# Patient Record
Sex: Female | Born: 1955
Health system: Southern US, Community
[De-identification: ages and names within clinical notes are randomized; demographics above are authoritative.]

## PROBLEM LIST (undated history)

## (undated) DIAGNOSIS — L409 Psoriasis, unspecified: Secondary | ICD-10-CM

## (undated) DIAGNOSIS — K5792 Diverticulitis of intestine, part unspecified, without perforation or abscess without bleeding: Secondary | ICD-10-CM

## (undated) DIAGNOSIS — E079 Disorder of thyroid, unspecified: Secondary | ICD-10-CM

## (undated) DIAGNOSIS — F32A Depression, unspecified: Secondary | ICD-10-CM

## (undated) DIAGNOSIS — I10 Essential (primary) hypertension: Secondary | ICD-10-CM

## (undated) DIAGNOSIS — L4050 Arthropathic psoriasis, unspecified: Secondary | ICD-10-CM

## (undated) DIAGNOSIS — R0600 Dyspnea, unspecified: Secondary | ICD-10-CM

## (undated) DIAGNOSIS — K219 Gastro-esophageal reflux disease without esophagitis: Secondary | ICD-10-CM

## (undated) DIAGNOSIS — T7840XA Allergy, unspecified, initial encounter: Secondary | ICD-10-CM

## (undated) DIAGNOSIS — R519 Headache, unspecified: Secondary | ICD-10-CM

## (undated) DIAGNOSIS — J189 Pneumonia, unspecified organism: Secondary | ICD-10-CM

## (undated) DIAGNOSIS — G4730 Sleep apnea, unspecified: Secondary | ICD-10-CM

## (undated) DIAGNOSIS — D649 Anemia, unspecified: Secondary | ICD-10-CM

## (undated) HISTORY — DX: Diverticulitis of intestine, part unspecified, without perforation or abscess without bleeding: K57.92

## (undated) HISTORY — DX: Disorder of thyroid, unspecified: E07.9

## (undated) HISTORY — DX: Sleep apnea, unspecified: G47.30

## (undated) HISTORY — PX: COLON SURGERY: SHX602

## (undated) HISTORY — PX: EYE SURGERY: SHX253

## (undated) HISTORY — PX: WISDOM TOOTH EXTRACTION: SHX21

## (undated) HISTORY — PX: BUNIONECTOMY: SHX129

## (undated) HISTORY — PX: TONSILLECTOMY: SUR1361

## (undated) HISTORY — DX: Allergy, unspecified, initial encounter: T78.40XA

## (undated) HISTORY — DX: Depression, unspecified: F32.A

## (undated) HISTORY — PX: COLOSTOMY TAKEDOWN: SHX5258

## (undated) HISTORY — DX: Psoriasis, unspecified: L40.9

---

## 2016-02-24 DIAGNOSIS — R55 Syncope and collapse: Secondary | ICD-10-CM | POA: Insufficient documentation

## 2016-02-24 DIAGNOSIS — R319 Hematuria, unspecified: Secondary | ICD-10-CM | POA: Insufficient documentation

## 2016-02-24 DIAGNOSIS — K5781 Diverticulitis of intestine, part unspecified, with perforation and abscess with bleeding: Secondary | ICD-10-CM | POA: Insufficient documentation

## 2016-02-24 DIAGNOSIS — K5732 Diverticulitis of large intestine without perforation or abscess without bleeding: Secondary | ICD-10-CM | POA: Insufficient documentation

## 2018-09-02 DIAGNOSIS — Z03818 Encounter for observation for suspected exposure to other biological agents ruled out: Secondary | ICD-10-CM | POA: Diagnosis not present

## 2018-09-02 DIAGNOSIS — E039 Hypothyroidism, unspecified: Secondary | ICD-10-CM | POA: Diagnosis not present

## 2018-12-02 DIAGNOSIS — K219 Gastro-esophageal reflux disease without esophagitis: Secondary | ICD-10-CM | POA: Diagnosis not present

## 2018-12-02 DIAGNOSIS — Z79899 Other long term (current) drug therapy: Secondary | ICD-10-CM | POA: Diagnosis not present

## 2018-12-02 DIAGNOSIS — E039 Hypothyroidism, unspecified: Secondary | ICD-10-CM | POA: Diagnosis not present

## 2018-12-02 DIAGNOSIS — E782 Mixed hyperlipidemia: Secondary | ICD-10-CM | POA: Diagnosis not present

## 2018-12-02 DIAGNOSIS — R7303 Prediabetes: Secondary | ICD-10-CM | POA: Diagnosis not present

## 2019-01-07 DIAGNOSIS — Z8719 Personal history of other diseases of the digestive system: Secondary | ICD-10-CM | POA: Diagnosis not present

## 2019-01-07 DIAGNOSIS — R1032 Left lower quadrant pain: Secondary | ICD-10-CM | POA: Diagnosis not present

## 2019-02-06 DIAGNOSIS — R7303 Prediabetes: Secondary | ICD-10-CM | POA: Diagnosis not present

## 2019-02-06 DIAGNOSIS — E039 Hypothyroidism, unspecified: Secondary | ICD-10-CM | POA: Diagnosis not present

## 2019-02-06 DIAGNOSIS — E782 Mixed hyperlipidemia: Secondary | ICD-10-CM | POA: Diagnosis not present

## 2019-02-06 DIAGNOSIS — Z79899 Other long term (current) drug therapy: Secondary | ICD-10-CM | POA: Diagnosis not present

## 2019-02-25 DIAGNOSIS — Z20828 Contact with and (suspected) exposure to other viral communicable diseases: Secondary | ICD-10-CM | POA: Diagnosis not present

## 2019-04-19 ENCOUNTER — Ambulatory Visit: Payer: Self-pay | Attending: Internal Medicine

## 2019-04-19 DIAGNOSIS — Z23 Encounter for immunization: Secondary | ICD-10-CM | POA: Insufficient documentation

## 2019-04-19 NOTE — Progress Notes (Signed)
   Covid-19 Vaccination Clinic  Name:  Siriyah Ambrosius    MRN: [id number] DOB: March 10, 1955  04/19/2019  Ms. Glab was observed post Covid-19 immunization for 30 minutes based on pre-vaccination screening without incidence. She was provided with Vaccine Information Sheet and instruction to access the V-Safe system.   Ms. Cowing was instructed to call 911 with any severe reactions post vaccine: Marland Kitchen Difficulty breathing  . Swelling of your face and throat  . A fast heartbeat  . A bad rash all over your body  . Dizziness and weakness    Immunizations Administered    Name Date Dose VIS Date Route   Pfizer COVID-19 Vaccine 04/19/2019  8:52 AM 0.3 mL 01/30/2019 Intramuscular   Manufacturer: [employer], [employer]   Lot: [id number]   NDC: [address]-8

## 2019-05-12 ENCOUNTER — Ambulatory Visit: Payer: Self-pay | Attending: Internal Medicine

## 2019-05-12 DIAGNOSIS — Z23 Encounter for immunization: Secondary | ICD-10-CM

## 2019-05-12 NOTE — Progress Notes (Signed)
   Covid-19 Vaccination Clinic  Name:  Maymunah Stegemann    MRN: [id number] DOB: 03/09/1955  05/12/2019  Ms. Grumbine was observed post Covid-19 immunization for 15 minutes without incident. She was provided with Vaccine Information Sheet and instruction to access the V-Safe system.   Ms. Childrey was instructed to call 911 with any severe reactions post vaccine: Marland Kitchen Difficulty breathing  . Swelling of face and throat  . A fast heartbeat  . A bad rash all over body  . Dizziness and weakness   Immunizations Administered    Name Date Dose VIS Date Route   Pfizer COVID-19 Vaccine 05/12/2019  8:48 AM 0.3 mL 01/30/2019 Intramuscular   Manufacturer: [employer], [employer]   Lot: [id number]   NDC: [address]-7

## 2019-05-14 DIAGNOSIS — K080 Exfoliation of teeth due to systemic causes: Secondary | ICD-10-CM | POA: Diagnosis not present

## 2019-06-02 DIAGNOSIS — K080 Exfoliation of teeth due to systemic causes: Secondary | ICD-10-CM | POA: Diagnosis not present

## 2019-06-04 DIAGNOSIS — K080 Exfoliation of teeth due to systemic causes: Secondary | ICD-10-CM | POA: Diagnosis not present

## 2019-09-26 DIAGNOSIS — E039 Hypothyroidism, unspecified: Secondary | ICD-10-CM | POA: Diagnosis not present

## 2019-12-22 DIAGNOSIS — M549 Dorsalgia, unspecified: Secondary | ICD-10-CM | POA: Diagnosis not present

## 2019-12-22 DIAGNOSIS — R319 Hematuria, unspecified: Secondary | ICD-10-CM | POA: Diagnosis not present

## 2020-01-28 NOTE — Progress Notes (Signed)
New patient visit   Patient: Sarah Stephenson   DOB: 26-Apr-1955   64 y.o. Female  MRN: [id number] Visit Date: 02/01/2020  Today's healthcare provider: Dortha Kern, PA-C   No chief complaint on file.  Subjective    Sarah Stephenson is a 64 y.o. female who presents today as a new patient to establish care.  HPI  Complaints today of fatigue, thinks it may be her thyroid.  Thyroid level was a little off but no changes were made at that time. She will need refill of her medications. Would like referral for her psoriasis.  Past Medical History:  Diagnosis Date  . Allergy   . Depression   . Diverticulitis   . Psoriasis   . Sleep apnea   . Thyroid disease    Family History  Problem Relation Age of Onset  . Hypertension Mother   . Hyperlipidemia Mother   . Arthritis Father   . Hyperlipidemia Father   . COPD Father   . Thyroid disease Sister   . Hypertension Brother   . Thyroid disease Brother   . Thyroid disease Brother    Past Surgical History:  Procedure Laterality Date  . BUNIONECTOMY    . COLON SURGERY - Colostomy for diverticulitis in 2018 (reversed 3 months later).    . EYE SURGERY    . TONSILLECTOMY    . WISDOM TOOTH EXTRACTION     Social History   Socioeconomic History  . Marital status: Married    Spouse name: Not on file  . Number of children: Not on file  . Years of education: Not on file  . Highest education level: Not on file  Occupational History  . Not on file  Tobacco Use  . Smoking status: Not on file  . Smokeless tobacco: Not on file  Substance and Sexual Activity  . Alcohol use: Not on file  . Drug use: Not on file  . Sexual activity: Not on file  Other Topics Concern  . Not on file  Social History Narrative  . Not on file   Social Determinants of Health   Financial Resource Strain: Not on file  Food Insecurity: Not on file  Transportation Needs: Not on file  Physical Activity: Not on file  Stress: Not on file  Social  Connections: Not on file   Outpatient Medications Prior to Visit  Medication Sig  . levothyroxine (SYNTHROID) 150 MCG tablet Take 150 mcg by mouth daily before breakfast.   No facility-administered medications prior to visit.   Allergies  Allergen Reactions  . Sulfa Antibiotics     Hives    Immunization History  Administered Date(s) Administered  . PFIZER SARS-COV-2 Vaccination 04/19/2019, 05/12/2019    Health Maintenance  Topic Date Due  . Hepatitis C Screening  Never done  . HIV Screening  Never done  . PAP SMEAR-Modifier  Never done  . MAMMOGRAM  Never done  . COLONOSCOPY  Never done  . INFLUENZA VACCINE  09/20/2019  . COVID-19 Vaccine (3 - Booster for Pfizer series) 11/12/2019  . TETANUS/TDAP  10/18/2026    Patient Care Team: Kristian Mogg, Maryjean Morn as PCP - General (Family Medicine)  Review of Systems  Constitutional: Positive for fatigue.  Respiratory: Negative.   Cardiovascular: Positive for leg swelling.  Endocrine: Negative.   Genitourinary: Negative.   Musculoskeletal: Positive for arthralgias and back pain.  Skin: Positive for rash.  Allergic/Immunologic: Negative.   Hematological: Negative.       Objective  BP (!) 162/81 (BP Location: Right Arm, Patient Position: Sitting, Cuff Size: Normal)   Pulse 85   Temp 98.1 F (36.7 C) (Oral)   Ht 5\' 7"  (1.702 m)   Wt 213 lb (96.6 kg)   SpO2 98%   BMI 33.36 kg/m  Physical Exam Constitutional:      General: She is not in acute distress.    Appearance: She is well-developed and well-nourished.  HENT:     Head: Normocephalic and atraumatic.     Right Ear: Hearing and tympanic membrane normal.     Left Ear: Hearing and tympanic membrane normal.     Nose: Nose normal.     Mouth/Throat:     Pharynx: Oropharynx is clear.  Eyes:     General: Lids are normal. No scleral icterus.       Right eye: No discharge.        Left eye: No discharge.     Conjunctiva/sclera: Conjunctivae normal.   Cardiovascular:     Rate and Rhythm: Normal rate and regular rhythm.     Pulses: Normal pulses.     Heart sounds: Normal heart sounds.  Pulmonary:     Effort: Pulmonary effort is normal. No respiratory distress.  Abdominal:     General: Bowel sounds are normal.     Palpations: Abdomen is soft.  Musculoskeletal:        General: Normal range of motion.     Cervical back: Neck supple.  Skin:    General: Skin is intact.     Findings: Rash present. No lesion.     Comments: Erythematous and scaling patches on extensor surfaces of elbows and knee bilaterally.  Neurological:     Mental Status: She is alert and oriented to person, place, and time.  Psychiatric:        Mood and Affect: Mood and affect normal.        Speech: Speech normal.        Behavior: Behavior normal.        Thought Content: Thought content normal.      Depression Screen PHQ 2/9 Scores 02/01/2020  PHQ - 2 Score 2  PHQ- 9 Score 8   No results found for any visits on 02/01/20.  Assessment & Plan     1. Acquired hypothyroidism Ran out of Levothyroxine 150 mcg she has been taking for a couple years. Feeling fatigue but no COVID symptoms. History of hypothyroidism after RAI treatment for Grave's Disease. Has family history of hypothyroidism in some siblings. Recheck labs and refill meds. - T4 - TSH - Comprehensive metabolic panel - CBC with Differential/Platelet - levothyroxine (SYNTHROID) 150 MCG tablet; Take 1 tablet (150 mcg total) by mouth daily before breakfast.  Dispense: 90 tablet; Refill: 1  2. Psoriasis Rash on knees and elbows. Continues moisturizing creams. Some aching joints but no joint swelling. May need referral to dermatologist and/or rheumatologist if it progresses.  3. OSA (obstructive sleep apnea) Diagnosed in 2016 and unable to tolerate CPAP mask and hose.  4. Hx of Graves' disease Diagnosed 2004 and had RAI treatment in 2006. Been on Levothyroxine for acquired hypothyroidism. No  exophthalmos or hair loss.   No follow-ups on file.     I, Vanilla Heatherington, PA-C, have reviewed all documentation for this visit. The documentation on 02/01/20 for the exam, diagnosis, procedures, and orders are all accurate and complete.    02/03/20, PA-C  Dortha Kern [phone] (phone) [phone] (fax)  [facility]

## 2020-02-01 ENCOUNTER — Encounter: Payer: Self-pay | Admitting: Family Medicine

## 2020-02-01 ENCOUNTER — Other Ambulatory Visit: Payer: Self-pay

## 2020-02-01 ENCOUNTER — Ambulatory Visit: Payer: Federal, State, Local not specified - PPO | Admitting: Family Medicine

## 2020-02-01 VITALS — BP 162/81 | HR 85 | Temp 98.1°F | Ht 67.0 in | Wt 213.0 lb

## 2020-02-01 DIAGNOSIS — G4733 Obstructive sleep apnea (adult) (pediatric): Secondary | ICD-10-CM | POA: Diagnosis not present

## 2020-02-01 DIAGNOSIS — Z8639 Personal history of other endocrine, nutritional and metabolic disease: Secondary | ICD-10-CM

## 2020-02-01 DIAGNOSIS — E039 Hypothyroidism, unspecified: Secondary | ICD-10-CM

## 2020-02-01 DIAGNOSIS — L409 Psoriasis, unspecified: Secondary | ICD-10-CM | POA: Diagnosis not present

## 2020-02-01 MED ORDER — LEVOTHYROXINE SODIUM 150 MCG PO TABS: 150.0000 ug | ORAL_TABLET | Freq: Every day | ORAL | 1 refills | Status: DC

## 2020-02-02 ENCOUNTER — Other Ambulatory Visit: Payer: Self-pay

## 2020-02-02 ENCOUNTER — Telehealth: Payer: Self-pay | Admitting: Family Medicine

## 2020-02-02 DIAGNOSIS — E039 Hypothyroidism, unspecified: Secondary | ICD-10-CM

## 2020-02-02 LAB — CBC WITH DIFFERENTIAL/PLATELET
Basophils Absolute: 0 10*3/uL (ref 0.0–0.2)
Basos: 0 %
EOS (ABSOLUTE): 0 10*3/uL (ref 0.0–0.4)
Eos: 0 %
Hematocrit: 43.4 % (ref 34.0–46.6)
Hemoglobin: 15.2 g/dL (ref 11.1–15.9)
Immature Grans (Abs): 0 10*3/uL (ref 0.0–0.1)
Immature Granulocytes: 0 %
Lymphocytes Absolute: 1.8 10*3/uL (ref 0.7–3.1)
Lymphs: 24 %
MCH: 33.2 pg — ABNORMAL HIGH (ref 26.6–33.0)
MCHC: 35 g/dL (ref 31.5–35.7)
MCV: 95 fL (ref 79–97)
Monocytes Absolute: 0.5 10*3/uL (ref 0.1–0.9)
Monocytes: 7 %
Neutrophils Absolute: 5 10*3/uL (ref 1.4–7.0)
Neutrophils: 69 %
Platelets: 211 10*3/uL (ref 150–450)
RBC: 4.58 x10E6/uL (ref 3.77–5.28)
RDW: 13.3 % (ref 11.7–15.4)
WBC: 7.3 10*3/uL (ref 3.4–10.8)

## 2020-02-02 LAB — COMPREHENSIVE METABOLIC PANEL
ALT: 13 IU/L (ref 0–32)
AST: 19 IU/L (ref 0–40)
Albumin/Globulin Ratio: 1.6 (ref 1.2–2.2)
Albumin: 4.5 g/dL (ref 3.8–4.8)
Alkaline Phosphatase: 87 IU/L (ref 44–121)
BUN/Creatinine Ratio: 20 (ref 12–28)
BUN: 17 mg/dL (ref 8–27)
Bilirubin Total: 0.3 mg/dL (ref 0.0–1.2)
CO2: 23 mmol/L (ref 20–29)
Calcium: 9.9 mg/dL (ref 8.7–10.3)
Chloride: 102 mmol/L (ref 96–106)
Creatinine, Ser: 0.83 mg/dL (ref 0.57–1.00)
GFR calc Af Amer: 86 mL/min/{1.73_m2} (ref >59)
GFR calc non Af Amer: 75 mL/min/{1.73_m2} (ref >59)
Globulin, Total: 2.9 g/dL (ref 1.5–4.5)
Glucose: 102 mg/dL — ABNORMAL HIGH (ref 65–99)
Potassium: 4.6 mmol/L (ref 3.5–5.2)
Sodium: 140 mmol/L (ref 134–144)
Total Protein: 7.4 g/dL (ref 6.0–8.5)

## 2020-02-02 LAB — TSH: TSH: 11.5 u[IU]/mL — ABNORMAL HIGH (ref 0.450–4.500)

## 2020-02-02 LAB — T4: T4, Total: 8.4 ug/dL (ref 4.5–12.0)

## 2020-02-02 MED ORDER — LEVOTHYROXINE SODIUM 175 MCG PO TABS: 175.0000 ug | ORAL_TABLET | Freq: Every day | ORAL | 3 refills | Status: DC

## 2020-02-02 NOTE — Telephone Encounter (Signed)
Patient returned call to office and she was red lab note by Dortha Kern written 02/02/20.  She verbalized understanding of all information.  She will have new lab drawn in 3 months. She was given hours for lab. She will pick up new RX.

## 2020-04-29 DIAGNOSIS — E039 Hypothyroidism, unspecified: Secondary | ICD-10-CM | POA: Diagnosis not present

## 2020-04-30 LAB — TSH: TSH: 2.63 u[IU]/mL (ref 0.450–4.500)

## 2020-07-04 ENCOUNTER — Other Ambulatory Visit: Payer: Self-pay

## 2020-07-04 ENCOUNTER — Ambulatory Visit: Payer: Federal, State, Local not specified - PPO | Admitting: Dermatology

## 2020-07-04 DIAGNOSIS — L409 Psoriasis, unspecified: Secondary | ICD-10-CM

## 2020-07-04 MED ORDER — TALTZ 80 MG/ML ~~LOC~~ SOAJ: 80.0000 mg | SUBCUTANEOUS | 2 refills | Status: DC

## 2020-07-04 MED ORDER — TALTZ 80 MG/ML ~~LOC~~ SOAJ: 160.0000 mg | SUBCUTANEOUS | 0 refills | Status: DC

## 2020-07-04 MED ORDER — TALTZ 80 MG/ML ~~LOC~~ SOAJ: 80.0000 mg | SUBCUTANEOUS | 1 refills | Status: DC

## 2020-07-04 NOTE — Progress Notes (Signed)
   New Patient Visit  Subjective  Sarah Stephenson is a 65 y.o. female who presents for the following: New Patient (Initial Visit) (Patient here today for first initial visit. She is here today concerning psoriasis on bilateral lower legs at knees, elbows, and she reports some at her sacral region. She state she has been dealing with psoriasis 2015. She is currently not on any prescription medication. She only uses Aquaphor and benadryl to help. ).   Objective  Well appearing patient in no apparent distress; mood and affect are within normal limits.  A focused examination was performed including bilateral arms, hands, knees, legs, buttocks. Relevant physical exam findings are noted in the Assessment and Plan.  Objective  bilateral hands, elbows, knees and buttocks: Well-demarcated erythematous papules/plaques with silvery scale, guttate pink scaly papules. Swelling of finger joints.   Images            Assessment & Plan  Psoriasis of skin - severe with BSA 12% and Arthritis bilateral hands, elbows, knees and buttocks Chronic condition. Condition is bothersome to patient. Currently flared. Psoriasis is a chronic non-curable, but treatable genetic/hereditary disease that may have other systemic features affecting other organ systems such as joints (Psoriatic Arthritis). It is associated with an increased risk of inflammatory bowel disease, heart disease, non-alcoholic fatty liver disease, and depression.    Patient reports she is getting arthritis and her knees bother her in morning along with joint swelling of hands   Recommend Referral to [facility] rheumatology   Photos hands, knees, elbows, and buttock  BSA 12 % with psoriatic arthritis   Patient reports diverticulitis in the past   Pending labs: Medication sent to [facility] Taltz 160 mg inject subcutaneous at week 0 Start Talz inject 80 mg subcutaneous every 2 weeks for 4 -10 weeks.  Start Talz 80 mg Inject 80 mg  subcutaneous every 28 days for maintenance.   Pamphlet on taltz given   Ixekizumab (TALTZ) 80 MG/ML SOAJ - bilateral hands, elbows, knees and buttocks  Ixekizumab (TALTZ) 80 MG/ML SOAJ - bilateral hands, elbows, knees and buttocks  Ixekizumab (TALTZ) 80 MG/ML SOAJ - bilateral hands, elbows, knees and buttocks  Other Related Procedures HIV antibody (with reflex) Hep C Antibody Hep B Surface Antibody Hep B Surface Antigen QuantiFERON-TB Gold Plus CBC with Differential/Platelets CMP  Return in about 30 days (around 08/03/2020) for psoriasis follow up.   IAsher Muir, CMA, am acting as scribe for Armida Sans, MD.  Documentation: I have reviewed the above documentation for accuracy and completeness, and I agree with the above.  Armida Sans, MD

## 2020-07-04 NOTE — Patient Instructions (Signed)
Psoriasis is a chronic non-curable, but treatable genetic/hereditary disease that may have other systemic features affecting other organ systems such as joints (Psoriatic Arthritis). It is associated with an increased risk of inflammatory bowel disease, heart disease, non-alcoholic fatty liver disease, and depression.    Psoriasis - severe on systemic "biologic" treatment injections.  Psoriasis is a chronic non-curable, but treatable genetic/hereditary disease that may have other systemic features affecting other organ systems such as joints (Psoriatic Arthritis).  It is linked with heart disease, inflammatory bowel disease, non-alcoholic fatty liver disease, and depression. Significant skin psoriasis and/or psoriatic arthritis may have significant symptoms and affects activities of daily activity and often benefits from systemic "biologic" injection treatments.  These "biologic" treatments have some potential side effects including immunosuppression and require pre-treatment laboratory screening and periodic laboratory monitoring and periodic in person evaluation and monitoring by the attending dermatologist physician.  If you have any questions or concerns for your doctor, please call our main line at [phone] and press option 4 to reach your doctor's medical assistant. If no one answers, please leave a voicemail as directed and we will return your call as soon as possible. Messages left after 4 pm will be answered the following business day.   You may also send [place] a message via MyChart. We typically respond to MyChart messages within 1-2 business days.  For prescription refills, please ask your pharmacy to contact our office. Our fax number is [phone].  If you have an urgent issue when the clinic is closed that cannot wait until the next business day, you can page your doctor at the number below.    Please note that while we do our best to be available for urgent issues outside of office  hours, we are not available 24/7.   If you have an urgent issue and are unable to reach [place], you may choose to seek medical care at your doctor's office, retail clinic, urgent care center, or emergency room.  If you have a medical emergency, please immediately call 911 or go to the emergency department.  Pager Numbers  - Dr. Gwen Pounds: [phone]  - Dr. Neale Burly: [phone]  - Dr. Roseanne Reno: [phone]  In the event of inclement weather, please call our main line at [phone] for an update on the status of any delays or closures.  Dermatology Medication Tips: Please keep the boxes that topical medications come in in order to help keep track of the instructions about where and how to use these. Pharmacies typically print the medication instructions only on the boxes and not directly on the medication tubes.   If your medication is too expensive, please contact our office at [phone] option 4 or send [place] a message through MyChart.   We are unable to tell what your co-pay for medications will be in advance as this is different depending on your insurance coverage. However, we may be able to find a substitute medication at lower cost or fill out paperwork to get insurance to cover a needed medication.   If a prior authorization is required to get your medication covered by your insurance company, please allow [place] 1-2 business days to complete this process.  Drug prices often vary depending on where the prescription is filled and some pharmacies may offer cheaper prices.  The website www.goodrx.com contains coupons for medications through different pharmacies. The prices here do not account for what the cost may be with help from insurance (it may be cheaper with your insurance), but the website  can give you the price if you did not use any insurance.  - You can print the associated coupon and take it with your prescription to the pharmacy.  - You may also stop by our office during regular  business hours and pick up a GoodRx coupon card.  - If you need your prescription sent electronically to a different pharmacy, notify our office through [facility] or by phone at [phone] option 4.

## 2020-07-07 ENCOUNTER — Other Ambulatory Visit: Payer: Self-pay

## 2020-07-07 LAB — QUANTIFERON-TB GOLD PLUS
QuantiFERON Mitogen Value: >10 IU/mL
QuantiFERON Nil Value: 0.03 IU/mL
QuantiFERON TB1 Ag Value: 0.05 IU/mL
QuantiFERON TB2 Ag Value: 0.04 IU/mL
QuantiFERON-TB Gold Plus: NEGATIVE

## 2020-07-07 LAB — COMPREHENSIVE METABOLIC PANEL
ALT: 17 IU/L (ref 0–32)
AST: 20 IU/L (ref 0–40)
Albumin/Globulin Ratio: 1.8 (ref 1.2–2.2)
Albumin: 4.7 g/dL (ref 3.8–4.8)
Alkaline Phosphatase: 82 IU/L (ref 44–121)
BUN/Creatinine Ratio: 18 (ref 12–28)
BUN: 14 mg/dL (ref 8–27)
Bilirubin Total: <0.2 mg/dL (ref 0.0–1.2)
CO2: 23 mmol/L (ref 20–29)
Calcium: 10 mg/dL (ref 8.7–10.3)
Chloride: 101 mmol/L (ref 96–106)
Creatinine, Ser: 0.76 mg/dL (ref 0.57–1.00)
Globulin, Total: 2.6 g/dL (ref 1.5–4.5)
Glucose: 94 mg/dL (ref 65–99)
Potassium: 4.7 mmol/L (ref 3.5–5.2)
Sodium: 140 mmol/L (ref 134–144)
Total Protein: 7.3 g/dL (ref 6.0–8.5)
eGFR: 87 mL/min/{1.73_m2} (ref >59)

## 2020-07-07 LAB — CBC WITH DIFFERENTIAL/PLATELET
Basophils Absolute: 0 10*3/uL (ref 0.0–0.2)
Basos: 1 %
EOS (ABSOLUTE): 0.1 10*3/uL (ref 0.0–0.4)
Eos: 1 %
Hematocrit: 43.6 % (ref 34.0–46.6)
Hemoglobin: 15.2 g/dL (ref 11.1–15.9)
Immature Grans (Abs): 0 10*3/uL (ref 0.0–0.1)
Immature Granulocytes: 0 %
Lymphocytes Absolute: 2.2 10*3/uL (ref 0.7–3.1)
Lymphs: 34 %
MCH: 32.3 pg (ref 26.6–33.0)
MCHC: 34.9 g/dL (ref 31.5–35.7)
MCV: 93 fL (ref 79–97)
Monocytes Absolute: 0.5 10*3/uL (ref 0.1–0.9)
Monocytes: 7 %
Neutrophils Absolute: 3.7 10*3/uL (ref 1.4–7.0)
Neutrophils: 57 %
Platelets: 214 10*3/uL (ref 150–450)
RBC: 4.7 x10E6/uL (ref 3.77–5.28)
RDW: 12.7 % (ref 11.7–15.4)
WBC: 6.4 10*3/uL (ref 3.4–10.8)

## 2020-07-07 LAB — HEPATITIS C ANTIBODY: Hep C Virus Ab: <0.1 s/co ratio (ref 0.0–0.9)

## 2020-07-07 LAB — HIV ANTIBODY (ROUTINE TESTING W REFLEX): HIV Screen 4th Generation wRfx: NONREACTIVE

## 2020-07-07 LAB — HEPATITIS B SURFACE ANTIBODY,QUALITATIVE: Hep B Surface Ab, Qual: NONREACTIVE

## 2020-07-07 LAB — HEPATITIS B SURFACE ANTIGEN: Hepatitis B Surface Ag: NEGATIVE

## 2020-07-07 MED ORDER — TALTZ 80 MG/ML ~~LOC~~ SOSY: 80.0000 mg | PREFILLED_SYRINGE | Freq: Once | SUBCUTANEOUS | 0 refills | Status: AC

## 2020-07-07 NOTE — Progress Notes (Signed)
Final induction dose added for processing through [facility].

## 2020-07-11 ENCOUNTER — Encounter: Payer: Self-pay | Admitting: Dermatology

## 2020-07-11 NOTE — Addendum Note (Signed)
Addended by: Asher Muir A on: 07/11/2020 05:43 PM   Modules accepted: Orders

## 2020-07-12 ENCOUNTER — Telehealth: Payer: Self-pay

## 2020-07-12 NOTE — Telephone Encounter (Signed)
Patient informed of lab results. Awaiting Therapist, occupational for [employer].

## 2020-07-12 NOTE — Telephone Encounter (Signed)
-----   Message from Deirdre Evener, MD sent at 07/11/2020 12:53 PM EDT ----- All Lab May 2022 are NORMAL May start TALTZ  for psoriasis - please send order to [facility] if not already done. Make referral to Rheumatology as noted in chart.

## 2020-07-26 ENCOUNTER — Ambulatory Visit: Payer: Self-pay | Admitting: *Deleted

## 2020-07-26 NOTE — Telephone Encounter (Signed)
  Pt called in c/o having cramping and burning in her left lower abd that started suddenly on Saturday.   She had a colostomy in this area that has been reversed due to diverticulitis a few years ago.   It's in this area she is having pain.    "I really don't want to end up in the ED like I have in the past".  There are no appts available within the 24 hour timeframe per the protocol with any of the providers at [facility].   I have sent a high priority note asking if she can be worked in.    Pt is agreeable to someone calling her back regarding an appt.   She normally sees [employer], PA-C  She can be reached at [phone].   Reason for Disposition . Age > 60 years    diverticulitis  Answer Assessment - Initial Assessment Questions 1. LOCATION: "Where does it hurt?"      I'm cramping and burning.   Left lower side where the colostomy was.   2. RADIATION: "Does the pain shoot anywhere else?" (e.g., chest, back)     Yes towards center of my back. 3. ONSET: "When did the pain begin?" (e.g., minutes, hours or days ago)      Saturday  It started burning and cramping.   4. SUDDEN: "Gradual or sudden onset?"     Suddenly 5. PATTERN "Does the pain come and go, or is it constant?"    - If constant: "Is it getting better, staying the same, or worsening?"      (Note: Constant means the pain never goes away completely; most serious pain is constant and it progresses)     - If intermittent: "How long does it last?" "Do you have pain now?"     (Note: Intermittent means the pain goes away completely between bouts)     Intermittent   It feels weird today.   Feels different   No diarrhea or vomiting 6. SEVERITY: "How bad is the pain?"  (e.g., Scale 1-10; mild, moderate, or severe)   - MILD (1-3): doesn't interfere with normal activities, abdomen soft and not tender to touch    - MODERATE (4-7): interferes with normal activities or awakens from sleep, abdomen tender to touch    -  SEVERE (8-10): excruciating pain, doubled over, unable to do any normal activities      8 at worse   Generally 3-4 when not at it's worse 7. RECURRENT SYMPTOM: "Have you ever had this type of stomach pain before?" If Yes, ask: "When was the last time?" and "What happened that time?"      Yes  Had a colostomy but it was been reversed. 8. CAUSE: "What do you think is causing the stomach pain?"     Diverticulitis 9. RELIEVING/AGGRAVATING FACTORS: "What makes it better or worse?" (e.g., movement, antacids, bowel movement)     I take Nexium but that's for my stomach.    I've not taken it 10. OTHER SYMPTOMS: "Do you have any other symptoms?" (e.g., back pain, diarrhea, fever, urination pain, vomiting)       No diarrhea or vomiting.   Just a bloated feeling and a little distended. 11. PREGNANCY: "Is there any chance you are pregnant?" "When was your last menstrual period?"       N/A due to age  Protocols used: ABDOMINAL PAIN - [facility]

## 2020-07-26 NOTE — Telephone Encounter (Signed)
No availability in our office of [facility], patient agreeable with going to [facility]

## 2020-07-28 ENCOUNTER — Telehealth: Payer: Self-pay

## 2020-07-28 NOTE — Telephone Encounter (Signed)
Sarah Stephenson RX was denied. Patient's note states she is not using any topical treatments and nothing additional was prescribed.  Please advise.

## 2020-08-01 NOTE — Telephone Encounter (Signed)
Appeal check list sent in with information per Dr. Gwen Pounds.

## 2020-08-01 NOTE — Telephone Encounter (Signed)
I am not sure that changing biologics would make a difference. Her insurance company is arguing that she has not used any topicals. We could try Cosentyx but more then likely that would be the same response/denial we receive for that PA.

## 2020-08-16 DIAGNOSIS — L4050 Arthropathic psoriasis, unspecified: Secondary | ICD-10-CM | POA: Diagnosis not present

## 2020-08-16 DIAGNOSIS — L409 Psoriasis, unspecified: Secondary | ICD-10-CM | POA: Insufficient documentation

## 2020-08-16 DIAGNOSIS — Z7960 Long term (current) use of unspecified immunomodulators and immunosuppressants: Secondary | ICD-10-CM | POA: Insufficient documentation

## 2020-08-16 DIAGNOSIS — Z79899 Other long term (current) drug therapy: Secondary | ICD-10-CM | POA: Insufficient documentation

## 2020-08-17 ENCOUNTER — Ambulatory Visit: Payer: Federal, State, Local not specified - PPO | Admitting: Dermatology

## 2020-08-17 ENCOUNTER — Other Ambulatory Visit: Payer: Self-pay

## 2020-08-17 DIAGNOSIS — L409 Psoriasis, unspecified: Secondary | ICD-10-CM

## 2020-08-17 DIAGNOSIS — L4050 Arthropathic psoriasis, unspecified: Secondary | ICD-10-CM

## 2020-08-17 MED ORDER — IXEKIZUMAB 80 MG/ML ~~LOC~~ SOAJ
160.0000 mg | Freq: Once | SUBCUTANEOUS | Status: AC
  Administered 2020-08-17: 160 mg via SUBCUTANEOUS

## 2020-08-17 NOTE — Progress Notes (Deleted)
   Follow-Up Visit   Subjective  Sarah Stephenson is a 65 y.o. female who presents for the following: Psoriasis (Follow up -Treated in the past with Calcipotriene/Betamethasone cream and Fluocinolone .  Will start Taltz today. ).  The following portions of the chart were reviewed this encounter and updated as appropriate:   Allergies  Meds  Problems  Med Hx  Surg Hx  Fam Hx      Review of Systems:  No other skin or systemic complaints except as noted in HPI or Assessment and Plan.  Objective  Well appearing patient in no apparent distress; mood and affect are within normal limits.  A focused examination was performed including arms, legs, buttocks. Relevant physical exam findings are noted in the Assessment and Plan.  Pink plaques    Assessment & Plan  Psoriasis -severe and generalized not responding to topical treatments.  She will start systemic biologic Taltz injections today.  Labs from May 2022 reviewed and okay.  Psoriasis is a chronic non-curable, but treatable genetic/hereditary disease that may have other systemic features affecting other organ systems such as joints (Psoriatic Arthritis). It is associated with an increased risk of inflammatory bowel disease, heart disease, non-alcoholic fatty liver disease, and depression.     She was treated in the past with Calcipotriene/Betamethasone cream and Fluocinolone, which did not help much.  Start Taltz today - Talta 80 mg injected to right lower abdomen and left lower abdomen Lot #[id number] AD Exp 02/20/2022 - Will plan to train patient to self inject over the next few appointments.  Ixekizumab SOAJ 160 mg   Return in about 2 weeks (around 08/31/2020).  I, Joanie Coddington, CMA, am acting as scribe for Armida Sans, MD .  Documentation: I have reviewed the above documentation for accuracy and completeness, and I agree with the above.  Armida Sans, MD

## 2020-08-17 NOTE — Patient Instructions (Signed)

## 2020-08-27 ENCOUNTER — Encounter: Payer: Self-pay | Admitting: Dermatology

## 2020-08-29 NOTE — Progress Notes (Signed)
   Follow-Up Visit   Subjective  Sarah Stephenson is a 65 y.o. female who presents for the following: Psoriasis (Follow up -Treated in the past with Calcipotriene/Betamethasone cream and Fluocinolone .  Will start Taltz today. ).She was treated in the past with Calcipotriene/Betamethasone cream and Fluocinolone, which did not help much.  We recommended consideration for systemic Taltz on her last visit.  We also recommended evaluation by rheumatologist for joint evaluation.  She saw the rheumatologist and we reviewed the note and he felt she had psoriatic arthritis and he agreed with Sarah Stephenson initiation.    The following portions of the chart were reviewed this encounter and updated as appropriate:   Allergies  Meds  Problems  Med Hx  Surg Hx  Fam Hx      Review of Systems:  No other skin or systemic complaints except as noted in HPI or Assessment and Plan.  Objective  Well appearing patient in no apparent distress; mood and affect are within normal limits.  A focused examination was performed including arms, legs, buttocks. Relevant physical exam findings are noted in the Assessment and Plan.  Pink plaques    Assessment & Plan  Psoriasis Psoriasis with psoriatic arthritis Psoriasis is a chronic non-curable, but treatable genetic/hereditary disease that may have other systemic features affecting other organ systems such as joints (Psoriatic Arthritis). It is associated with an increased risk of inflammatory bowel disease, heart disease, non-alcoholic fatty liver disease, and depression.     She was treated in the past with Calcipotriene/Betamethasone cream and Fluocinolone, which did not help much. She saw the rheumatologist and we reviewed the note and he felt she had psoriatic arthritis and he agreed with Sarah Stephenson initiation.    Start Taltz today - Talta 80 mg injected to right lower abdomen and left lower abdomen Lot #[id number] AD Exp 02/20/2022 - Will plan to train patient to self inject  over the next few appointments.  Ixekizumab SOAJ 160 mg  Return in about 2 weeks (around 08/31/2020).  Documentation: I have reviewed the above documentation for accuracy and completeness, and I agree with the above.  Armida Sans, MD

## 2020-08-31 ENCOUNTER — Other Ambulatory Visit: Payer: Self-pay

## 2020-08-31 ENCOUNTER — Ambulatory Visit: Payer: Federal, State, Local not specified - PPO

## 2020-08-31 DIAGNOSIS — L409 Psoriasis, unspecified: Secondary | ICD-10-CM

## 2020-08-31 MED ORDER — IXEKIZUMAB 80 MG/ML ~~LOC~~ SOAJ
80.0000 mg | Freq: Once | SUBCUTANEOUS | Status: AC
  Administered 2020-08-31: 80 mg via SUBCUTANEOUS

## 2020-08-31 NOTE — Progress Notes (Signed)
Pt here for Taltz injection. 80 mg injected into right arm. Pt tolerated well.   Lot: [id number]  Exp: 02/2022

## 2020-09-14 ENCOUNTER — Other Ambulatory Visit: Payer: Self-pay

## 2020-09-14 ENCOUNTER — Ambulatory Visit: Payer: Federal, State, Local not specified - PPO

## 2020-09-14 DIAGNOSIS — L400 Psoriasis vulgaris: Secondary | ICD-10-CM

## 2020-09-14 MED ORDER — IXEKIZUMAB 80 MG/ML ~~LOC~~ SOAJ
80.0000 mg | Freq: Once | SUBCUTANEOUS | Status: AC
  Administered 2020-09-14: 80 mg via SUBCUTANEOUS

## 2020-09-14 NOTE — Progress Notes (Signed)
Patient here for Taltz injection. This is patients "week 4" of dosing.  Taltz 80mg  injected into right thigh. Patient tolerated well.   LOT: [id number] EXP: 03/15/2022

## 2020-09-19 DIAGNOSIS — U071 COVID-19: Secondary | ICD-10-CM | POA: Insufficient documentation

## 2020-09-26 ENCOUNTER — Telehealth: Payer: Federal, State, Local not specified - PPO

## 2020-09-26 ENCOUNTER — Telehealth: Payer: Federal, State, Local not specified - PPO | Admitting: Physician Assistant

## 2020-09-26 ENCOUNTER — Encounter: Payer: Self-pay | Admitting: Physician Assistant

## 2020-09-26 ENCOUNTER — Telehealth: Payer: Self-pay

## 2020-09-26 DIAGNOSIS — U071 COVID-19: Secondary | ICD-10-CM

## 2020-09-26 MED ORDER — ALBUTEROL SULFATE HFA 108 (90 BASE) MCG/ACT IN AERS: 2.0000 | INHALATION_SPRAY | RESPIRATORY_TRACT | 0 refills | Status: AC | PRN

## 2020-09-26 MED ORDER — BENZONATATE 100 MG PO CAPS: 100.0000 mg | ORAL_CAPSULE | Freq: Three times a day (TID) | ORAL | 0 refills | Status: AC | PRN

## 2020-09-26 NOTE — Progress Notes (Signed)
Unable to connect via audio

## 2020-09-26 NOTE — Telephone Encounter (Signed)
Pt on Taltz scheduled for to come here Wednesday for her next injection, tested positive for Covid August 1, she would like to know if she should continue her Taltz injection this week or wait?

## 2020-09-26 NOTE — Patient Instructions (Signed)
1. COVID-19     - Persistent cough, low grade fevers.  No SOB  - albuterol and tessalon  - if symptoms worsen will need an in-person visit for physical exam and CXR

## 2020-09-26 NOTE — Progress Notes (Signed)
[name], bracken are scheduled for a virtual visit with your provider today.    Just as we do with appointments in the office, we must obtain your consent to participate.  Your consent will be active for this visit and any virtual visit you may have with one of our providers in the next 365 days.    If you have a MyChart account, I can also send a copy of this consent to you electronically.  All virtual visits are billed to your insurance company just like a traditional visit in the office.  As this is a virtual visit, video technology does not allow for your provider to perform a traditional examination.  This may limit your provider's ability to fully assess your condition.  If your provider identifies any concerns that need to be evaluated in person or the need to arrange testing such as labs, EKG, etc, we will make arrangements to do so.    Although advances in technology are sophisticated, we cannot ensure that it will always work on either your end or our end.  If the connection with a video visit is poor, we may have to switch to a telephone visit.  With either a video or telephone visit, we are not always able to ensure that we have a secure connection.   I need to obtain your verbal consent now.   Are you willing to proceed with your visit today?   Sarah Stephenson has provided verbal consent on 09/26/2020 for a virtual visit (video or telephone).   Dierdre Forth, PA-C 09/26/2020  9:40 AM   Date:  09/26/2020   ID:  Sarah Stephenson, DOB 1955-04-26, MRN [id number]  Patient Location: Home Provider Location: Home Office   Participants: Patient and Provider for Visit and Wrap up  Method of visit: Video  Location of Patient: Home Location of Provider: Home Office Consent was obtain for visit over the video. Services rendered by provider: Visit was performed via video  A video enabled telemedicine application was used and I verified that I am speaking with the correct person using two  identifiers.  PCP:  Tamsen Roers, PA-C   Chief Complaint:  COVID-19  History of Present Illness:    Sarah Stephenson is a 65 y.o. female with history as stated below. Presents video telehealth for an acute care visit  Onset of symptoms was 8 days ago and symptoms have been persistent and include: cough, low grade fever  Denies SOB, nausea and vomiting.   Modifying factors include: Advil, Mucinex with some relief  No other aggravating or relieving factors.  No other c/o.  The patient does have symptoms concerning for COVID-19 infection (fever, chills, cough, or new shortness of breath).  Patient has been tested for COVID during this illness - result: positive  Past Medical, Surgical, Social History, Allergies, and Medications have been Reviewed.  Patient Active Problem List   Diagnosis Date Noted   COVID-19 09/19/2020   Long-term use of immunosuppressant medication 08/16/2020   Psoriasis 08/16/2020   Diverticulitis of colon 02/24/2016   Diverticulitis of intestine with abscess and bleeding 02/24/2016   Hematuria 02/24/2016   Near syncope 02/24/2016    Social History   Tobacco Use   Smoking status: Every Day    Packs/day: 0.50    Years: 44.00    Pack years: 22.00    Types: Cigarettes   Smokeless tobacco: Not on file  Substance Use Topics   Alcohol use: Yes     Current Outpatient Medications:  levothyroxine (SYNTHROID) 175 MCG tablet, Take 1 tablet (175 mcg total) by mouth daily before breakfast., Disp: 90 tablet, Rfl: 3   metroNIDAZOLE (FLAGYL) 500 MG tablet, Take 500 mg by mouth 3 (three) times daily., Disp: , Rfl:    Allergies  Allergen Reactions   Oxycodone Other (See Comments)    Gets very hyper with this.   Sulfa Antibiotics Hives    Hives   Venlafaxine Nausea Only     Review of Systems  Constitutional:  Positive for fever. Negative for chills.  HENT:  Negative for congestion, ear pain and sore throat.   Eyes:  Negative for blurred vision and  double vision.  Respiratory:  Positive for cough. Negative for shortness of breath and wheezing.   Cardiovascular:  Negative for chest pain, palpitations and leg swelling.  Gastrointestinal:  Negative for abdominal pain, diarrhea, nausea and vomiting.  Genitourinary:  Negative for dysuria.  Musculoskeletal:  Negative for myalgias.  Skin:  Negative for rash.  Neurological:  Negative for loss of consciousness, weakness and headaches.  Psychiatric/Behavioral:  Negative for hallucinations. The patient is not nervous/anxious.   See HPI for history of present illness.  Physical Exam Vitals and nursing note reviewed.  Constitutional:      General: She is not in acute distress.    Appearance: She is well-developed. She is not diaphoretic.     Comments: Awake, alert, nontoxic appearance  HENT:     Head: Normocephalic and atraumatic.     Mouth/Throat:     Pharynx: No oropharyngeal exudate.  Eyes:     General: No scleral icterus.    Conjunctiva/sclera: Conjunctivae normal.  Cardiovascular:     Rate and Rhythm: Normal rate and regular rhythm.  Pulmonary:     Effort: Pulmonary effort is normal. No respiratory distress.     Breath sounds: Normal breath sounds. No wheezing.     Comments: Speaks in full sentences Abdominal:     General: Bowel sounds are normal.     Palpations: Abdomen is soft. There is no mass.     Tenderness: There is no abdominal tenderness. There is no guarding or rebound.  Musculoskeletal:        General: Normal range of motion.     Cervical back: Normal range of motion and neck supple.  Skin:    General: Skin is warm and dry.  Neurological:     Mental Status: She is alert.     Comments: Speech is clear and goal oriented Moves extremities without ataxia  Psychiatric:        Mood and Affect: Mood normal.              A&P  1. COVID-19     - Persistent cough, low grade fevers.  No SOB  - albuterol and tessalon  - if symptoms worsen will need an in-person visit  for physical exam and CXR   Patient voiced understanding and agreement to plan.   Time:   Today, I have spent 15 minutes with the patient with telehealth technology discussing the above problems, reviewing the chart, previous notes, medications and orders.    Tests Ordered: No orders of the defined types were placed in this encounter.   Medication Changes: No orders of the defined types were placed in this encounter.    Disposition:  Follow up PCP or urgent care if symptoms persist  Signed, Dierdre Forth, PA-C  09/26/2020 9:40 AM

## 2020-09-27 ENCOUNTER — Telehealth: Payer: Self-pay

## 2020-09-27 NOTE — Telephone Encounter (Signed)
Called pt discussed delay Taltz this week, resume next week

## 2020-09-28 ENCOUNTER — Ambulatory Visit: Payer: Federal, State, Local not specified - PPO

## 2020-10-05 ENCOUNTER — Ambulatory Visit (INDEPENDENT_AMBULATORY_CARE_PROVIDER_SITE_OTHER): Payer: Federal, State, Local not specified - PPO

## 2020-10-05 ENCOUNTER — Other Ambulatory Visit: Payer: Self-pay

## 2020-10-05 ENCOUNTER — Telehealth: Payer: Self-pay

## 2020-10-05 DIAGNOSIS — L400 Psoriasis vulgaris: Secondary | ICD-10-CM

## 2020-10-05 NOTE — Progress Notes (Signed)
Patient here today for Taltz injection. Patient on "week 6" of dosing. She has been delayed due to having COVID-19 several weeks ago.  Patient learned to self inject today. I did not touch the patient for injection. Patient injected into left upper thigh and tolerated injection well. Patient also trained with training kit to feel comfortable before injection.

## 2020-10-05 NOTE — Telephone Encounter (Signed)
Patient has started Altamease Oiler and today was "week 6" of dosing. She has 6 more weeks left (3 injections) before the loading dose is completed. She is comfortable injecting at home. She would like to know when would you like to see her back in the office for a follow up? At the end of her loading dose? Sooner or later?

## 2020-10-05 NOTE — Telephone Encounter (Signed)
Patient advised and scheduled.  

## 2020-11-16 ENCOUNTER — Ambulatory Visit: Payer: Federal, State, Local not specified - PPO | Admitting: Dermatology

## 2020-11-16 ENCOUNTER — Other Ambulatory Visit: Payer: Self-pay

## 2020-11-16 DIAGNOSIS — L4050 Arthropathic psoriasis, unspecified: Secondary | ICD-10-CM | POA: Diagnosis not present

## 2020-11-16 DIAGNOSIS — L409 Psoriasis, unspecified: Secondary | ICD-10-CM

## 2020-11-16 MED ORDER — IXEKIZUMAB 80 MG/ML ~~LOC~~ SOAJ
80.0000 mg | Freq: Once | SUBCUTANEOUS | Status: AC
  Administered 2020-11-16: 80 mg via SUBCUTANEOUS

## 2020-11-16 NOTE — Patient Instructions (Signed)

## 2020-11-16 NOTE — Progress Notes (Signed)
   Follow-Up Visit   Subjective  Sarah Stephenson is a 65 y.o. female who presents for the following: Psoriasis (2 months f/u Psoriasis, pt taking Taltz injections with a good response ).  The following portions of the chart were reviewed this encounter and updated as appropriate:   Allergies  Meds  Problems  Med Hx  Surg Hx  Fam Hx     Review of Systems:  No other skin or systemic complaints except as noted in HPI or Assessment and Plan.  Objective  Well appearing patient in no apparent distress; mood and affect are within normal limits.  A focused examination was performed including face,arms,knees,buttock. Relevant physical exam findings are noted in the Assessment and Plan.  arms,knees,elbows Minimal involvement on the arms, knees    Assessment & Plan  Psoriasis of skin and Psoriatic Arthritis - skin and jointsimproving on Taltz  Not to goal. arms,knees,elbows  Psoriasis is a chronic non-curable, but treatable genetic/hereditary disease that may have other systemic features affecting other organ systems such as joints (Psoriatic Arthritis). It is associated with an increased risk of inflammatory bowel disease, heart disease, non-alcoholic fatty liver disease, and depression.     Pt was evaluation by rheumatologist for joint evaluation, we reviewed the note and he felt she had psoriatic arthritis and he agreed with Altamease Oiler initiation.    Cont Taltz 80 mg injection once week  Lot# [id number] AC Exp 04/13/2022  Related Medications Ixekizumab SOAJ 80 mg  Return in about 4 months (around 03/18/2021) for Psorasis .  IAngelique Holm, CMA, am acting as scribe for Armida Sans, MD .  Documentation: I have reviewed the above documentation for accuracy and completeness, and I agree with the above.  Armida Sans, MD

## 2020-11-17 ENCOUNTER — Encounter: Payer: Self-pay | Admitting: Dermatology

## 2020-12-16 DIAGNOSIS — Z7960 Long term (current) use of unspecified immunomodulators and immunosuppressants: Secondary | ICD-10-CM | POA: Diagnosis not present

## 2020-12-16 DIAGNOSIS — L4050 Arthropathic psoriasis, unspecified: Secondary | ICD-10-CM | POA: Diagnosis not present

## 2020-12-16 DIAGNOSIS — L409 Psoriasis, unspecified: Secondary | ICD-10-CM | POA: Diagnosis not present

## 2020-12-28 ENCOUNTER — Other Ambulatory Visit: Payer: Self-pay | Admitting: Internal Medicine

## 2020-12-28 DIAGNOSIS — Z1231 Encounter for screening mammogram for malignant neoplasm of breast: Secondary | ICD-10-CM

## 2021-01-30 ENCOUNTER — Other Ambulatory Visit: Payer: Self-pay

## 2021-01-30 DIAGNOSIS — L409 Psoriasis, unspecified: Secondary | ICD-10-CM

## 2021-01-30 MED ORDER — TALTZ 80 MG/ML ~~LOC~~ SOAJ: 80.0000 mg | SUBCUTANEOUS | 2 refills | Status: DC

## 2021-02-27 ENCOUNTER — Ambulatory Visit
Admission: RE | Admit: 2021-02-27 | Discharge: 2021-02-27 | Disposition: A | Discharge status: Home / Self Care | Payer: Federal, State, Local not specified - PPO | Source: Ambulatory Visit | Attending: Internal Medicine | Admitting: Internal Medicine

## 2021-02-27 ENCOUNTER — Other Ambulatory Visit: Payer: Self-pay

## 2021-02-27 DIAGNOSIS — Z1231 Encounter for screening mammogram for malignant neoplasm of breast: Secondary | ICD-10-CM | POA: Diagnosis present

## 2021-03-23 ENCOUNTER — Other Ambulatory Visit: Payer: Self-pay | Admitting: Rheumatology

## 2021-03-23 DIAGNOSIS — M25531 Pain in right wrist: Secondary | ICD-10-CM

## 2021-03-23 DIAGNOSIS — L4050 Arthropathic psoriasis, unspecified: Secondary | ICD-10-CM

## 2021-03-29 ENCOUNTER — Ambulatory Visit: Payer: Federal, State, Local not specified - PPO | Admitting: Dermatology

## 2021-03-29 ENCOUNTER — Other Ambulatory Visit: Payer: Self-pay

## 2021-03-29 DIAGNOSIS — Z79899 Other long term (current) drug therapy: Secondary | ICD-10-CM

## 2021-03-29 DIAGNOSIS — L409 Psoriasis, unspecified: Secondary | ICD-10-CM | POA: Diagnosis not present

## 2021-03-29 DIAGNOSIS — L4050 Arthropathic psoriasis, unspecified: Secondary | ICD-10-CM

## 2021-03-29 MED ORDER — TALTZ 80 MG/ML ~~LOC~~ SOAJ: 80.0000 mg | SUBCUTANEOUS | 5 refills | Status: DC

## 2021-03-29 NOTE — Progress Notes (Signed)
° °  Follow-Up Visit   Subjective  Sarah Stephenson is a 66 y.o. female who presents for the following: Psoriasis (With psoriatic arthritis - Taltz once a month. She was on Methotrexate until last week. Dr Allena Katz took he off because her liver enzymes went up. Dr Allena Katz will check her liver enzymes again at the end of the month and decide how to treat her going forward. Skin condition is good, PsA is not doing as well.).  The following portions of the chart were reviewed this encounter and updated as appropriate:   Tobacco   Allergies   Meds   Problems   Med Hx   Surg Hx   Fam Hx      Review of Systems:  No other skin or systemic complaints except as noted in HPI or Assessment and Plan.  Objective  Well appearing patient in no apparent distress; mood and affect are within normal limits.  A focused examination was performed including arms, legs. Relevant physical exam findings are noted in the Assessment and Plan.  Elbow and knees are clear. Fingernails are almost clear. Buttocks are clear per patient.  Assessment & Plan  Psoriasis of the skin doing well and improving on Taltz. Psoriatic arthritis not doing well - patient is followed by Dr. Allena Katz who recently took her off of methotrexate due to elevated liver tests  Chronic and persistent condition with duration or expected duration over one year. Condition is symptomatic/ bothersome to patient. Not currently at goal.  Psoriasis - severe on systemic biologic treatment injections.  Psoriasis is a chronic non-curable, but treatable genetic/hereditary disease that may have other systemic features affecting other organ systems such as joints (Psoriatic Arthritis).  It is linked with heart disease, inflammatory bowel disease, non-alcoholic fatty liver disease, and depression. Significant skin psoriasis and/or psoriatic arthritis may have significant symptoms and affects activities of daily activity and often benefits from systemic biologic injection  treatments.  These biologic treatments have some potential side effects including immunosuppression and require pre-treatment laboratory screening and periodic laboratory monitoring and periodic in person evaluation and monitoring by the attending dermatologist physician (long term medication management).  Skin is better controlled on Taltz. Continue Taltz injections once monthly. We will consider adding topical on follow-up.  QuantiFERON-TB Gold Plus  Related Medications Ixekizumab (TALTZ) 80 MG/ML SOAJ Inject 80 mg into the skin every 28 (twenty-eight) days. For maintenance.  Return in about 6 months (around 09/26/2021) for Psoriasis.  I, Joanie Coddington, CMA, am acting as scribe for Armida Sans, MD . Documentation: I have reviewed the above documentation for accuracy and completeness, and I agree with the above.  Armida Sans, MD

## 2021-03-29 NOTE — Patient Instructions (Signed)

## 2021-04-02 ENCOUNTER — Encounter: Payer: Self-pay | Admitting: Dermatology

## 2021-04-04 ENCOUNTER — Ambulatory Visit
Admission: RE | Admit: 2021-04-04 | Discharge: 2021-04-04 | Disposition: A | Discharge status: Home / Self Care | Payer: Federal, State, Local not specified - PPO | Source: Ambulatory Visit | Attending: Rheumatology | Admitting: Rheumatology

## 2021-04-04 ENCOUNTER — Other Ambulatory Visit: Payer: Self-pay

## 2021-04-04 DIAGNOSIS — M25531 Pain in right wrist: Secondary | ICD-10-CM | POA: Insufficient documentation

## 2021-04-04 DIAGNOSIS — L4050 Arthropathic psoriasis, unspecified: Secondary | ICD-10-CM | POA: Diagnosis present

## 2021-07-28 LAB — QUANTIFERON-TB GOLD PLUS
QuantiFERON Mitogen Value: 9.9 IU/mL
QuantiFERON Nil Value: 0.63 IU/mL
QuantiFERON TB1 Ag Value: 0.01 IU/mL
QuantiFERON TB2 Ag Value: 0.03 IU/mL
QuantiFERON-TB Gold Plus: NEGATIVE

## 2021-07-31 ENCOUNTER — Telehealth: Payer: Self-pay

## 2021-07-31 NOTE — Telephone Encounter (Signed)
Advised patient of results/hd  

## 2021-07-31 NOTE — Telephone Encounter (Signed)
-----   Message from Deirdre Evener, MD sent at 07/30/2021  4:29 PM EDT ----- TB test / Quantiferon Gold done 07/25/2021 was negative/ normal  Continue Taltz Keep 09/20/2021 appt

## 2021-09-01 ENCOUNTER — Other Ambulatory Visit: Payer: Self-pay | Admitting: Dermatology

## 2021-09-01 DIAGNOSIS — L409 Psoriasis, unspecified: Secondary | ICD-10-CM

## 2021-09-20 ENCOUNTER — Ambulatory Visit: Payer: Federal, State, Local not specified - PPO | Admitting: Dermatology

## 2021-09-20 DIAGNOSIS — L918 Other hypertrophic disorders of the skin: Secondary | ICD-10-CM

## 2021-09-20 DIAGNOSIS — L409 Psoriasis, unspecified: Secondary | ICD-10-CM | POA: Diagnosis not present

## 2021-09-20 DIAGNOSIS — Z79899 Other long term (current) drug therapy: Secondary | ICD-10-CM

## 2021-09-20 MED ORDER — TALTZ 80 MG/ML ~~LOC~~ SOAJ: SUBCUTANEOUS | 5 refills | Status: DC

## 2021-09-20 NOTE — Patient Instructions (Signed)
Due to recent changes in healthcare laws, you may see results of your pathology and/or laboratory studies on MyChart before the doctors have had a chance to review them. We understand that in some cases there may be results that are confusing or concerning to you. Please understand that not all results are received at the same time and often the doctors may need to interpret multiple results in order to provide you with the best plan of care or course of treatment. Therefore, we ask that you please give us 2 business days to thoroughly review all your results before contacting the office for clarification. Should we see a critical lab result, you will be contacted sooner.   If You Need Anything After Your Visit  If you have any questions or concerns for your doctor, please call our main line at [phone] and press option 4 to reach your doctor's medical assistant. If no one answers, please leave a voicemail as directed and we will return your call as soon as possible. Messages left after 4 pm will be answered the following business day.   You may also send us a message via MyChart. We typically respond to MyChart messages within 1-2 business days.  For prescription refills, please ask your pharmacy to contact our office. Our fax number is [phone].  If you have an urgent issue when the clinic is closed that cannot wait until the next business day, you can page your doctor at the number below.    Please note that while we do our best to be available for urgent issues outside of office hours, we are not available 24/7.   If you have an urgent issue and are unable to reach us, you may choose to seek medical care at your doctor's office, retail clinic, urgent care center, or emergency room.  If you have a medical emergency, please immediately call 911 or go to the emergency department.  Pager Numbers  - Dr. Kowalski: [phone]  - Dr. Moye: [phone]  - Dr. Stewart:  [phone]  In the event of inclement weather, please call our main line at [phone] for an update on the status of any delays or closures.  Dermatology Medication Tips: Please keep the boxes that topical medications come in in order to help keep track of the instructions about where and how to use these. Pharmacies typically print the medication instructions only on the boxes and not directly on the medication tubes.   If your medication is too expensive, please contact our office at [phone] option 4 or send us a message through MyChart.   We are unable to tell what your co-pay for medications will be in advance as this is different depending on your insurance coverage. However, we may be able to find a substitute medication at lower cost or fill out paperwork to get insurance to cover a needed medication.   If a prior authorization is required to get your medication covered by your insurance company, please allow us 1-2 business days to complete this process.  Drug prices often vary depending on where the prescription is filled and some pharmacies may offer cheaper prices.  The website www.goodrx.com contains coupons for medications through different pharmacies. The prices here do not account for what the cost may be with help from insurance (it may be cheaper with your insurance), but the website can give you the price if you did not use any insurance.  - You can print the associated coupon and take it with   your prescription to the pharmacy.  - You may also stop by our office during regular business hours and pick up a GoodRx coupon card.  - If you need your prescription sent electronically to a different pharmacy, notify our office through [facility] MyChart or by phone at [phone] option 4.     Si Usted Necesita Algo Despus de Su Visita  Tambin puede enviarnos un mensaje a travs de MyChart. Por lo general respondemos a los mensajes de MyChart en el transcurso de 1 a 2  das hbiles.  Para renovar recetas, por favor pida a su farmacia que se ponga en contacto con nuestra oficina. Nuestro nmero de fax es el [phone].  Si tiene un asunto urgente cuando la clnica est cerrada y que no puede esperar hasta el siguiente da hbil, puede llamar/localizar a su doctor(a) al nmero que aparece a continuacin.   Por favor, tenga en cuenta que aunque hacemos todo lo posible para estar disponibles para asuntos urgentes fuera del horario de oficina, no estamos disponibles las 24 horas del da, los 7 das de la semana.   Si tiene un problema urgente y no puede comunicarse con nosotros, puede optar por buscar atencin mdica  en el consultorio de su doctor(a), en una clnica privada, en un centro de atencin urgente o en una sala de emergencias.  Si tiene una emergencia mdica, por favor llame inmediatamente al 911 o vaya a la sala de emergencias.  Nmeros de bper  - Dr. Kowalski: [phone]  - Dra. Moye: [phone]  - Dra. Stewart: [phone]  En caso de inclemencias del tiempo, por favor llame a nuestra lnea principal al [phone] para una actualizacin sobre el estado de cualquier retraso o cierre.  Consejos para la medicacin en dermatologa: Por favor, guarde las cajas en las que vienen los medicamentos de uso tpico para ayudarle a seguir las instrucciones sobre dnde y cmo usarlos. Las farmacias generalmente imprimen las instrucciones del medicamento slo en las cajas y no directamente en los tubos del medicamento.   Si su medicamento es muy caro, por favor, pngase en contacto con nuestra oficina llamando al [phone] y presione la opcin 4 o envenos un mensaje a travs de MyChart.   No podemos decirle cul ser su copago por los medicamentos por adelantado ya que esto es diferente dependiendo de la cobertura de su seguro. Sin embargo, es posible que podamos encontrar un medicamento sustituto a menor costo o llenar un formulario para que el  seguro cubra el medicamento que se considera necesario.   Si se requiere una autorizacin previa para que su compaa de seguros cubra su medicamento, por favor permtanos de 1 a 2 das hbiles para completar este proceso.  Los precios de los medicamentos varan con frecuencia dependiendo del lugar de dnde se surte la receta y alguna farmacias pueden ofrecer precios ms baratos.  El sitio web www.goodrx.com tiene cupones para medicamentos de diferentes farmacias. Los precios aqu no tienen en cuenta lo que podra costar con la ayuda del seguro (puede ser ms barato con su seguro), pero el sitio web puede darle el precio si no utiliz ningn seguro.  - Puede imprimir el cupn correspondiente y llevarlo con su receta a la farmacia.  - Tambin puede pasar por nuestra oficina durante el horario de atencin regular y recoger una tarjeta de cupones de GoodRx.  - Si necesita que su receta se enve electrnicamente a una farmacia diferente, informe a nuestra oficina a travs de MyChart de [facility]   o por telfono llamando al [phone] y presione la opcin 4.  

## 2021-09-20 NOTE — Progress Notes (Unsigned)
   Follow-Up Visit   Subjective  Sarah Stephenson is a 66 y.o. female who presents for the following: Psoriasis (With Psoriatic arthritis, knees, buttocks, elbows, inframammary, clear today,  Taltz 80mg  sq injections q month, joints some improved) and skin tags (Neck, noticed since starting Taltz).  The following portions of the chart were reviewed this encounter and updated as appropriate:   Tobacco  Allergies  Meds  Problems  Med Hx  Surg Hx  Fam Hx     Review of Systems:  No other skin or systemic complaints except as noted in HPI or Assessment and Plan.  Objective  Well appearing patient in no apparent distress; mood and affect are within normal limits.  A focused examination was performed including arms, knees, trunk. Relevant physical exam findings are noted in the Assessment and Plan.  elbows, knees, buttocks Skin clear today   Assessment & Plan   Acrochordons (Skin Tags) - Fleshy, skin-colored pedunculated papules - Benign appearing.  - Observe. - If desired, they can be removed with an in office procedure that is not covered by insurance. - Please call the clinic if you notice any new or changing lesions.   Psoriasis elbows, knees, buttocks With Psoriatic Arthritis, some improvement on Taltz TB test 07/2021 negative Labs from 08/01/2021 reviewed and wnl Pt has been on Taltz ~ yr Psoriasis - severe on systemic "biologic" treatment injections.  Psoriasis is a chronic non-curable, but treatable genetic/hereditary disease that may have other systemic features affecting other organ systems such as joints (Psoriatic Arthritis).  It is linked with heart disease, inflammatory bowel disease, non-alcoholic fatty liver disease, and depression. Significant skin psoriasis and/or psoriatic arthritis may have significant symptoms and affects activities of daily activity and often benefits from systemic "biologic" injection treatments.  These "biologic" treatments have some  potential side effects including immunosuppression and require pre-treatment laboratory screening and periodic laboratory monitoring and periodic in person evaluation and monitoring by the attending dermatologist physician (long term medication management).   Cont Taltz 80mg  sq inj q month  Reviewed risks of biologics including immunosuppression, infections, injection site reaction, and failure to improve condition. Goal is control of skin condition, not cure.  Some older biologics such as Humira and Enbrel may slightly increase risk of malignancy and may worsen congestive heart failure.  Talz and Cosentyx may cause inflammatory bowel disease to flare. The use of biologics requires long term medication management, including periodic office visits and monitoring of blood work.   Long term medication management.  Patient is using long term (months to years) prescription medication  to control their dermatologic condition.  These medications require periodic monitoring to evaluate for efficacy and side effects and may require periodic laboratory monitoring.  Related Medications Ixekizumab (TALTZ) 80 MG/ML SOAJ INJECT ONE PEN SUBCUTANEOUSLY EVERY 4 WEEKS. REFRIGERATE. ALLOW PEN TO REACH ROOM TEMP PRIOR TO INJECTION.  Return in about 6 months (around 03/23/2022) for TBSE, Psoriasis f/u.  I, , RMA, am acting as scribe for 05/22/2022, MD . Documentation: I have reviewed the above documentation for accuracy and completeness, and I agree with the above.  Ardis Rowan, MD

## 2021-09-21 ENCOUNTER — Encounter: Payer: Self-pay | Admitting: Dermatology

## 2022-01-17 ENCOUNTER — Other Ambulatory Visit: Payer: Self-pay | Admitting: Physician Assistant

## 2022-01-17 DIAGNOSIS — N644 Mastodynia: Secondary | ICD-10-CM

## 2022-01-17 DIAGNOSIS — N6342 Unspecified lump in left breast, subareolar: Secondary | ICD-10-CM

## 2022-01-31 ENCOUNTER — Ambulatory Visit
Admission: RE | Admit: 2022-01-31 | Discharge: 2022-01-31 | Disposition: A | Discharge status: Home / Self Care | Payer: Federal, State, Local not specified - PPO | Source: Ambulatory Visit | Attending: Physician Assistant | Admitting: Physician Assistant

## 2022-01-31 ENCOUNTER — Other Ambulatory Visit: Payer: Self-pay | Admitting: Physician Assistant

## 2022-01-31 ENCOUNTER — Inpatient Hospital Stay: Admission: RE | Admit: 2022-01-31 | Payer: Federal, State, Local not specified - PPO | Source: Ambulatory Visit

## 2022-01-31 ENCOUNTER — Other Ambulatory Visit (HOSPITAL_COMMUNITY): Payer: Self-pay | Admitting: Diagnostic Radiology

## 2022-01-31 DIAGNOSIS — N63 Unspecified lump in unspecified breast: Secondary | ICD-10-CM | POA: Diagnosis present

## 2022-01-31 DIAGNOSIS — N6342 Unspecified lump in left breast, subareolar: Secondary | ICD-10-CM | POA: Diagnosis not present

## 2022-01-31 DIAGNOSIS — N61 Mastitis without abscess: Secondary | ICD-10-CM | POA: Insufficient documentation

## 2022-01-31 DIAGNOSIS — N644 Mastodynia: Secondary | ICD-10-CM

## 2022-01-31 MED ORDER — LIDOCAINE HCL (PF) 1 % IJ SOLN
3.0000 mL | Freq: Once | INTRAMUSCULAR | Status: AC
  Administered 2022-01-31: 3 mL via INTRADERMAL

## 2022-01-31 MED ORDER — LIDOCAINE-EPINEPHRINE 1 %-1:100000 IJ SOLN
10.0000 mL | Freq: Once | INTRAMUSCULAR | Status: AC
  Administered 2022-01-31: 10 mL via INTRADERMAL

## 2022-02-05 LAB — AEROBIC/ANAEROBIC CULTURE W GRAM STAIN (SURGICAL/DEEP WOUND)

## 2022-02-07 ENCOUNTER — Other Ambulatory Visit: Payer: Self-pay | Admitting: Physician Assistant

## 2022-02-07 DIAGNOSIS — N6002 Solitary cyst of left breast: Secondary | ICD-10-CM

## 2022-03-05 ENCOUNTER — Other Ambulatory Visit: Payer: Self-pay | Admitting: Dermatology

## 2022-03-05 DIAGNOSIS — L409 Psoriasis, unspecified: Secondary | ICD-10-CM

## 2022-03-05 NOTE — Telephone Encounter (Signed)
Refills x2 then RTC

## 2022-03-28 ENCOUNTER — Encounter: Payer: Self-pay | Admitting: Dermatology

## 2022-03-28 ENCOUNTER — Ambulatory Visit: Payer: Federal, State, Local not specified - PPO | Admitting: Dermatology

## 2022-03-28 VITALS — BP 139/80

## 2022-03-28 DIAGNOSIS — R609 Edema, unspecified: Secondary | ICD-10-CM | POA: Diagnosis not present

## 2022-03-28 DIAGNOSIS — L409 Psoriasis, unspecified: Secondary | ICD-10-CM

## 2022-03-28 DIAGNOSIS — Z79899 Other long term (current) drug therapy: Secondary | ICD-10-CM | POA: Diagnosis not present

## 2022-03-28 DIAGNOSIS — L4050 Arthropathic psoriasis, unspecified: Secondary | ICD-10-CM | POA: Diagnosis not present

## 2022-03-28 MED ORDER — MOMETASONE FUROATE 0.1 % EX CREA: 1.0000 | TOPICAL_CREAM | Freq: Every day | CUTANEOUS | 6 refills | Status: DC | PRN

## 2022-03-28 NOTE — Progress Notes (Signed)
   Follow-Up Visit   Subjective  Sarah Stephenson is a 67 y.o. female who presents for the following: Psoriasis (6 month follow up - Taltz injections - condition is well controlled except she has noticed a small patch coming back on her right elbow. She does have some joint issue. She saw Dr Posey Pronto yesterday and he ordered some labs.).  The following portions of the chart were reviewed this encounter and updated as appropriate:   Tobacco  Allergies  Meds  Problems  Med Hx  Surg Hx  Fam Hx     Review of Systems:  No other skin or systemic complaints except as noted in HPI or Assessment and Plan.  Objective  Well appearing patient in no apparent distress; mood and affect are within normal limits.  A focused examination was performed including face, arms. Relevant physical exam findings are noted in the Assessment and Plan.  Mild plaque of right elbow, otherwise clear.  Infraorbital areas Edema   Assessment & Plan  Psoriasis And psoriatic arthritis Counseling on psoriasis and coordination of care  psoriasis is a chronic non-curable, but treatable genetic/hereditary disease that may have other systemic features affecting other organ systems such as joints (Psoriatic Arthritis). It is associated with an increased risk of inflammatory bowel disease, heart disease, non-alcoholic fatty liver disease, and depression.  Treatments include light and laser treatments; topical medications; and systemic medications including oral and injectables.  CMP and CBC from 11/2021 and Quantiferon Gold from 09/2021 reviewed today. Continue Talz injections once monthly.  Start Mometasone cream qd to any areas of flare prn  Patient saw Dr. Posey Pronto, rheumatologist yesterday and he ordered some labs.  He did not recommend any changes to her treatment.  Long term medication management.  Patient is using long term (months to years) prescription medication  to control their dermatologic condition.  These  medications require periodic monitoring to evaluate for efficacy and side effects and may require periodic laboratory monitoring.  mometasone (ELOCON) 0.1 % cream Apply 1 Application topically daily as needed (Rash). Related Medications Ixekizumab (TALTZ) 80 MG/ML SOAJ INJECT ONE PEN SUBCUTANEOUSLY EVERY 4 WEEKS. REFRIGERATE. ALLOW PEN TO REACH ROOM TEMP PRIOR TO INJECTION.  Edema, Infraorbital areas Secondary to pressure from CPAP mask Recommend 1% hydrocortisone cream qod prn  Return in about 6 months (around 09/26/2022) for Psoriasis.  I, Ashok Cordia, CMA, am acting as scribe for Sarina Ser, MD . Documentation: I have reviewed the above documentation for accuracy and completeness, and I agree with the above.  Sarina Ser, MD

## 2022-03-28 NOTE — Patient Instructions (Addendum)
Recommend 1% hydrocortisone cream every other day for swelling from CPAP mask    Due to recent changes in healthcare laws, you may see results of your pathology and/or laboratory studies on MyChart before the doctors have had a chance to review them. We understand that in some cases there may be results that are confusing or concerning to you. Please understand that not all results are received at the same time and often the doctors may need to interpret multiple results in order to provide you with the best plan of care or course of treatment. Therefore, we ask that you please give [place] 2 business days to thoroughly review all your results before contacting the office for clarification. Should we see a critical lab result, you will be contacted sooner.   If You Need Anything After Your Visit  If you have any questions or concerns for your doctor, please call our main line at [phone] and press option 4 to reach your doctor's medical assistant. If no one answers, please leave a voicemail as directed and we will return your call as soon as possible. Messages left after 4 pm will be answered the following business day.   You may also send [place] a message via [place]. We typically respond to MyChart messages within 1-2 business days.  For prescription refills, please ask your pharmacy to contact our office. Our fax number is [phone].  If you have an urgent issue when the clinic is closed that cannot wait until the next business day, you can page your doctor at the number below.    Please note that while we do our best to be available for urgent issues outside of office hours, we are not available 24/7.   If you have an urgent issue and are unable to reach [place], you may choose to seek medical care at your doctor's office, retail clinic, urgent care center, or emergency room.  If you have a medical emergency, please immediately call 911 or go to the emergency department.  Pager Numbers  - Dr.  Nehemiah Massed: [phone]  - Dr. Laurence Ferrari: [phone]  - Dr. Nicole Kindred: [phone]  In the event of inclement weather, please call our main line at [phone] for an update on the status of any delays or closures.  Dermatology Medication Tips: Please keep the boxes that topical medications come in in order to help keep track of the instructions about where and how to use these. Pharmacies typically print the medication instructions only on the boxes and not directly on the medication tubes.   If your medication is too expensive, please contact our office at [phone] option 4 or send [place] a message through [place].   We are unable to tell what your co-pay for medications will be in advance as this is different depending on your insurance coverage. However, we may be able to find a substitute medication at lower cost or fill out paperwork to get insurance to cover a needed medication.   If a prior authorization is required to get your medication covered by your insurance company, please allow [place] 1-2 business days to complete this process.  Drug prices often vary depending on where the prescription is filled and some pharmacies may offer cheaper prices.  The website www.goodrx.com contains coupons for medications through different pharmacies. The prices here do not account for what the cost may be with help from insurance (it may be cheaper with your insurance), but the website can give you the price if you did not  use any insurance.  - You can print the associated coupon and take it with your prescription to the pharmacy.  - You may also stop by our office during regular business hours and pick up a GoodRx coupon card.  - If you need your prescription sent electronically to a different pharmacy, notify our office through [facility] or by phone at [phone] option 4.     Si Usted Necesita Algo Despus de Su Visita  Tambin puede enviarnos un mensaje a travs de Pharmacist, community. Por lo  general respondemos a los mensajes de MyChart en el transcurso de 1 a 2 das hbiles.  Para renovar recetas, por favor pida a su farmacia que se ponga en contacto con nuestra oficina. Harland Dingwall de fax es [place] [phone].  Si tiene un asunto urgente cuando la clnica est cerrada y que no puede esperar hasta el siguiente da hbil, puede llamar/localizar a su doctor(a) al nmero que aparece a continuacin.   Por favor, tenga en cuenta que aunque hacemos todo lo posible para estar disponibles para asuntos urgentes fuera del horario de [place], no estamos disponibles las 24 horas del da, los 7 das de la [place].   Si tiene un problema urgente y no puede comunicarse con nosotros, puede optar por buscar atencin mdica  en el consultorio de su doctor(a), en una clnica privada, en un centro de atencin urgente o en una sala de emergencias.  Si tiene Engineering geologist, por favor llame inmediatamente al 911 o vaya a la sala de emergencias.  Nmeros de bper  - Dr. Nehemiah Massed: [phone]  - Dra. Moye: [phone]  - Dra. Nicole Kindred: [phone]  En caso de inclemencias del [place], por favor llame a Johnsie Kindred principal al [phone] para una actualizacin sobre el [place] de cualquier retraso o cierre.  Consejos para la medicacin en dermatologa: Por favor, guarde las cajas en las que vienen los medicamentos de uso tpico para ayudarle a seguir las instrucciones sobre dnde y cmo usarlos. Las farmacias generalmente imprimen las instrucciones del medicamento slo en las cajas y no directamente en los tubos del [place].   Si su medicamento es muy caro, por favor, pngase en contacto con Zigmund Daniel llamando al [phone] y presione la opcin 4 o envenos un mensaje a travs de Pharmacist, community.   No podemos decirle cul ser su copago por los medicamentos por adelantado ya que esto es diferente dependiendo de la cobertura de su seguro. Sin embargo, es posible que podamos encontrar un  medicamento sustituto a Electrical engineer un formulario para que el seguro cubra el medicamento que se considera necesario.   Si se requiere una autorizacin previa para que su compaa de seguros [place] su medicamento, por favor permtanos de 1 a 2 das hbiles para completar este proceso.  Los precios de los medicamentos varan con frecuencia dependiendo del Environmental consultant de dnde se surte la receta y alguna farmacias pueden ofrecer precios ms baratos.  El sitio web www.goodrx.com tiene cupones para medicamentos de Airline pilot. Los precios aqu no tienen en cuenta lo que podra costar con la ayuda del seguro (puede ser ms barato con su seguro), pero el sitio web puede darle el precio si no utiliz Research scientist (physical sciences).  - Puede imprimir el cupn correspondiente y llevarlo con su receta a la farmacia.  - Tambin puede pasar por nuestra oficina durante el horario de atencin regular y Charity fundraiser una tarjeta de cupones de GoodRx.  - Si necesita que su receta se enve electrnicamente  a [employer], informe a nuestra oficina a travs de MyChart de [place] o por telfono llamando al [phone] y presione la opcin 4.

## 2022-03-29 ENCOUNTER — Ambulatory Visit: Payer: Federal, State, Local not specified - PPO | Admitting: Dermatology

## 2022-05-08 ENCOUNTER — Other Ambulatory Visit: Payer: Self-pay | Admitting: Dermatology

## 2022-05-08 DIAGNOSIS — L409 Psoriasis, unspecified: Secondary | ICD-10-CM

## 2022-06-12 ENCOUNTER — Encounter: Payer: Self-pay | Admitting: Internal Medicine

## 2022-06-12 ENCOUNTER — Other Ambulatory Visit: Payer: Self-pay | Admitting: Family Medicine

## 2022-06-12 DIAGNOSIS — N63 Unspecified lump in unspecified breast: Secondary | ICD-10-CM

## 2022-06-14 ENCOUNTER — Ambulatory Visit
Admission: RE | Admit: 2022-06-14 | Discharge: 2022-06-14 | Disposition: A | Discharge status: Home / Self Care | Payer: Federal, State, Local not specified - PPO | Source: Ambulatory Visit | Attending: Family Medicine | Admitting: Family Medicine

## 2022-06-14 DIAGNOSIS — Z1239 Encounter for other screening for malignant neoplasm of breast: Secondary | ICD-10-CM | POA: Insufficient documentation

## 2022-06-14 DIAGNOSIS — N63 Unspecified lump in unspecified breast: Secondary | ICD-10-CM | POA: Diagnosis present

## 2022-06-14 DIAGNOSIS — N6320 Unspecified lump in the left breast, unspecified quadrant: Secondary | ICD-10-CM | POA: Insufficient documentation

## 2022-06-14 MED ORDER — LIDOCAINE HCL (PF) 1 % IJ SOLN
10.0000 mL | Freq: Once | INTRAMUSCULAR | Status: AC
  Administered 2022-06-14: 10 mL

## 2022-06-15 ENCOUNTER — Encounter: Payer: Self-pay | Admitting: Family Medicine

## 2022-06-18 ENCOUNTER — Other Ambulatory Visit: Payer: Self-pay | Admitting: Family Medicine

## 2022-06-18 DIAGNOSIS — N611 Abscess of the breast and nipple: Secondary | ICD-10-CM

## 2022-06-20 LAB — AEROBIC/ANAEROBIC CULTURE W GRAM STAIN (SURGICAL/DEEP WOUND)

## 2022-06-22 ENCOUNTER — Other Ambulatory Visit: Payer: Federal, State, Local not specified - PPO

## 2022-06-29 ENCOUNTER — Other Ambulatory Visit: Payer: Federal, State, Local not specified - PPO

## 2022-10-03 ENCOUNTER — Other Ambulatory Visit: Payer: Self-pay | Admitting: General Surgery

## 2022-10-03 DIAGNOSIS — N611 Abscess of the breast and nipple: Secondary | ICD-10-CM

## 2022-10-15 ENCOUNTER — Ambulatory Visit
Admission: RE | Admit: 2022-10-15 | Discharge: 2022-10-15 | Disposition: A | Discharge status: Home / Self Care | Payer: Federal, State, Local not specified - PPO | Source: Ambulatory Visit | Attending: General Surgery | Admitting: General Surgery

## 2022-10-15 DIAGNOSIS — N611 Abscess of the breast and nipple: Secondary | ICD-10-CM | POA: Diagnosis present

## 2022-10-23 ENCOUNTER — Other Ambulatory Visit: Payer: Self-pay | Admitting: Dermatology

## 2022-10-23 DIAGNOSIS — L409 Psoriasis, unspecified: Secondary | ICD-10-CM

## 2022-10-31 ENCOUNTER — Ambulatory Visit: Payer: Federal, State, Local not specified - PPO | Admitting: Dermatology

## 2022-12-03 ENCOUNTER — Ambulatory Visit: Payer: Federal, State, Local not specified - PPO | Admitting: Dermatology

## 2022-12-03 DIAGNOSIS — Z79899 Other long term (current) drug therapy: Secondary | ICD-10-CM

## 2022-12-03 DIAGNOSIS — Z7189 Other specified counseling: Secondary | ICD-10-CM | POA: Diagnosis not present

## 2022-12-03 DIAGNOSIS — L4050 Arthropathic psoriasis, unspecified: Secondary | ICD-10-CM | POA: Diagnosis not present

## 2022-12-03 DIAGNOSIS — L409 Psoriasis, unspecified: Secondary | ICD-10-CM

## 2022-12-03 NOTE — Patient Instructions (Signed)

## 2022-12-03 NOTE — Progress Notes (Signed)
   Follow-Up Visit   Subjective  Sarah Stephenson is a 67 y.o. female who presents for the following: 6 month follow up - Taltz injections - condition is well controlled except she has noticed a small patches in her ears, using Mometasone cream with a fair response.    The following portions of the chart were reviewed this encounter and updated as appropriate: medications, allergies, medical history  Review of Systems:  No other skin or systemic complaints except as noted in HPI or Assessment and Plan.  Objective  Well appearing patient in no apparent distress; mood and affect are within normal limits.  Areas Examined:scalp,face,ears    Relevant exam findings are noted in the Assessment and Plan.     Assessment & Plan   Psoriasis  Related Procedures QuantiFERON-TB Gold Plus  Related Medications mometasone (ELOCON) 0.1 % cream Apply 1 Application topically daily as needed (Rash).  Ixekizumab (TALTZ) 80 MG/ML SOAJ INJECT 1 PEN UNDER THE SKIN EVERY 4 WEEKS  Psoriasis And psoriatic arthritis  Mild plaque of the ears, otherwise clear.  BSA 1%  Counseling on psoriasis and coordination of care  psoriasis is a chronic non-curable, but treatable genetic/hereditary disease that may have other systemic features affecting other organ systems such as joints (Psoriatic Arthritis). It is associated with an increased risk of inflammatory bowel disease, heart disease, non-alcoholic fatty liver disease, and depression.  Treatments include light and laser treatments; topical medications; and systemic medications including oral and injectables.    Continue Talz injections once monthly.  Continue  Mometasone cream qd to any areas of flare prn   Patient saw Dr. Allena Katz, rheumatologist today and he ordered some labs.  He did not recommend any changes to her treatment.    Labs ordered QuantiFeron gold    Long term medication management.  Patient is using long term (months to years)  prescription medication  to control their dermatologic condition.  These medications require periodic monitoring to evaluate for efficacy and side effects and may require periodic laboratory monitoring.  Return in about 6 months (around 06/03/2023) for Psoriasis, TBSE .  IAngelique Holm, CMA, am acting as scribe for Armida Sans, MD .   Documentation: I have reviewed the above documentation for accuracy and completeness, and I agree with the above.  Armida Sans, MD

## 2022-12-06 ENCOUNTER — Telehealth: Payer: Self-pay

## 2022-12-06 DIAGNOSIS — L409 Psoriasis, unspecified: Secondary | ICD-10-CM

## 2022-12-06 LAB — QUANTIFERON-TB GOLD PLUS
QuantiFERON Mitogen Value: 7.18 [IU]/mL
QuantiFERON Nil Value: 0.02 [IU]/mL
QuantiFERON TB1 Ag Value: 0.02 [IU]/mL
QuantiFERON TB2 Ag Value: 0.03 [IU]/mL
QuantiFERON-TB Gold Plus: NEGATIVE

## 2022-12-06 MED ORDER — TALTZ 80 MG/ML ~~LOC~~ SOAJ: SUBCUTANEOUS | 5 refills | Status: DC

## 2022-12-06 NOTE — Telephone Encounter (Signed)
Patient informed of lab results refills sent to pharmacy.

## 2022-12-06 NOTE — Telephone Encounter (Signed)
-----   Message from Armida Sans sent at 12/06/2022 12:56 PM EDT ----- TB test / Quantiferon Gold from 12/03/2022 was normal / Negative Pt on Taltz for Psoriasis / Psoriatic Arthritis Please send in Taltz Rx Keep follow up appt

## 2022-12-11 ENCOUNTER — Encounter: Payer: Self-pay | Admitting: Dermatology

## 2022-12-20 ENCOUNTER — Other Ambulatory Visit: Payer: Self-pay | Admitting: General Surgery

## 2022-12-20 DIAGNOSIS — N611 Abscess of the breast and nipple: Secondary | ICD-10-CM

## 2022-12-25 ENCOUNTER — Other Ambulatory Visit: Payer: Self-pay | Admitting: Dermatology

## 2022-12-25 DIAGNOSIS — L409 Psoriasis, unspecified: Secondary | ICD-10-CM

## 2023-01-13 ENCOUNTER — Other Ambulatory Visit: Payer: Self-pay | Admitting: Medical Genetics

## 2023-01-13 DIAGNOSIS — Z006 Encounter for examination for normal comparison and control in clinical research program: Secondary | ICD-10-CM

## 2023-01-25 IMAGING — MR MR WRIST*R* W/O CM
6 series · 40 of 40 positions shown · non-contrast
Comparison: X-ray 03/17/2021

CLINICAL DATA: Persistent right wrist pain after fall 3 months ago

EXAM:
MR OF THE RIGHT WRIST WITHOUT CONTRAST
TECHNIQUE: Multiplanar, multisequence MR imaging of the right wrist was
performed. No intravenous contrast was administered.

[Series 5: T2 fat-sat · axial · right · 2.0mm · 0.47mm/px · z∈[+7,+62]mm · 9 of 25 slices shown (1 of 2)]
[im 1/25]
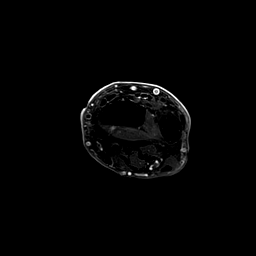
[im 4/25]
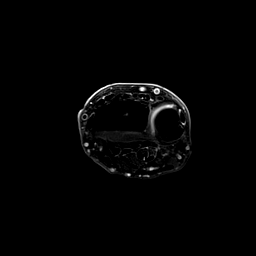
[im 7/25]
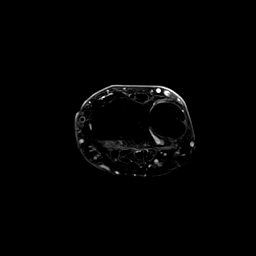
[im 10/25]
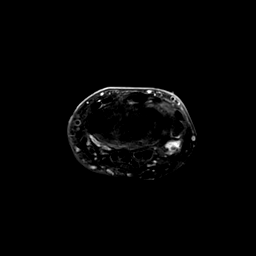
[im 13/25]
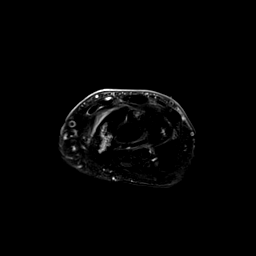
[im 16/25]
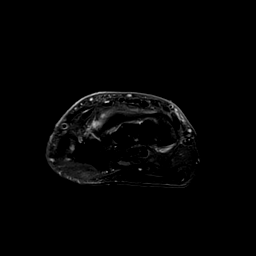
[im 19/25]
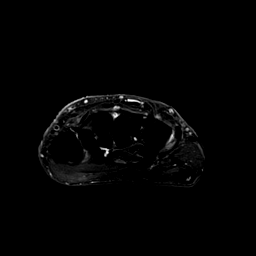
[im 22/25]
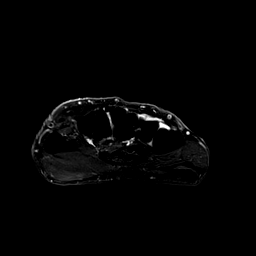
[im 25/25]
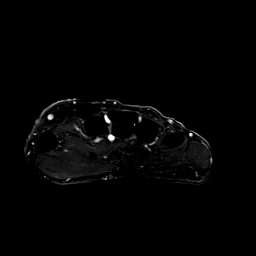

[Series 7: T1 · coronal · right · 3.0mm · 0.39mm/px · 5 of 13 slices shown (1 of 2)]
[im 1/13]
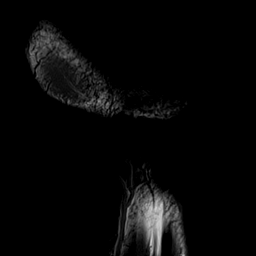
[im 4/13]
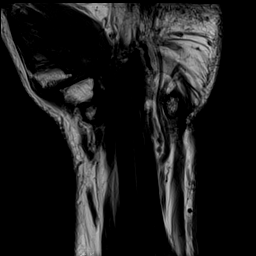
[im 7/13]
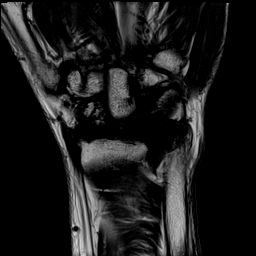
[im 10/13]
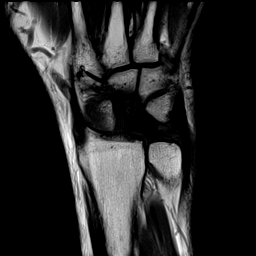
[im 13/13]
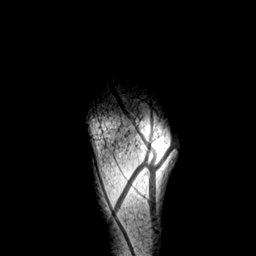

[Series 8: T2 fat-sat · coronal · right · 3.0mm · 0.39mm/px · 5 of 13 slices shown (2 of 2)]
[im 1/13]
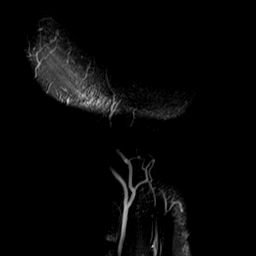
[im 4/13]
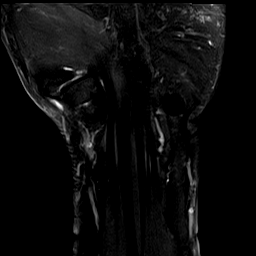
[im 7/13]
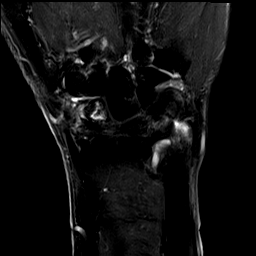
[im 10/13]
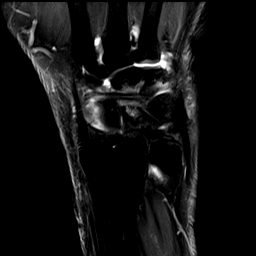
[im 13/13]
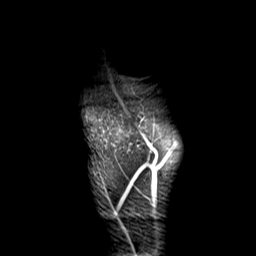

[Series 9: PD fat-sat · coronal · right · 3.0mm · 0.39mm/px · 5 of 13 slices shown (1 of 2)]
[im 1/13]
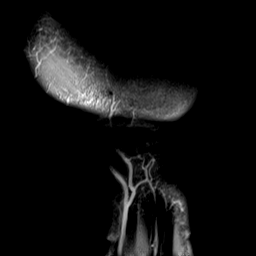
[im 4/13]
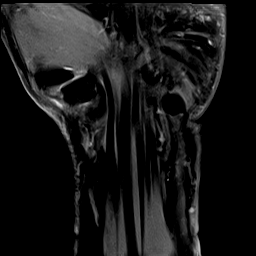
[im 7/13]
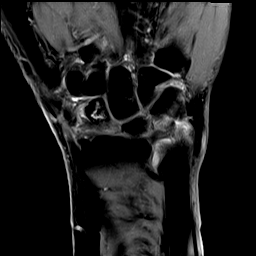
[im 10/13]
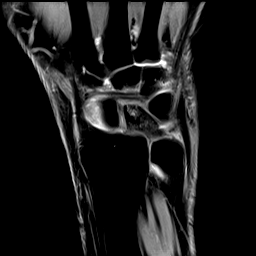
[im 13/13]
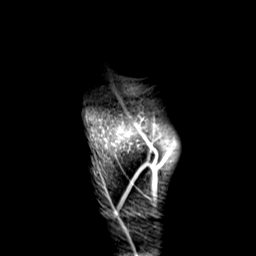

[Series 10: PD fat-sat · sagittal · right · 3.0mm · 0.39mm/px · 7 of 20 slices shown (2 of 2)]
[im 1/20]
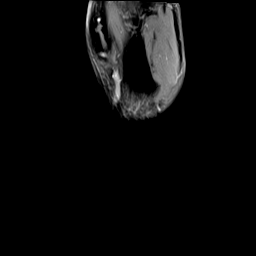
[im 4/20]
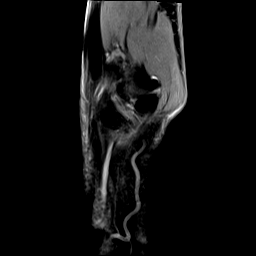
[im 7/20]
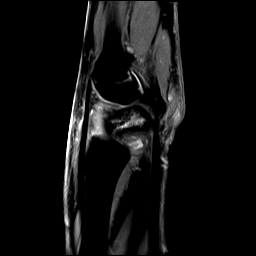
[im 10/20]
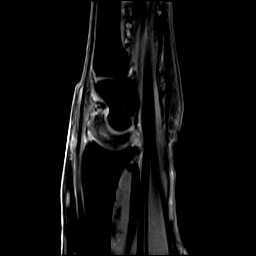
[im 13/20]
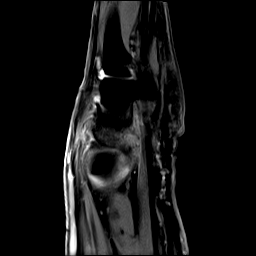
[im 16/20]
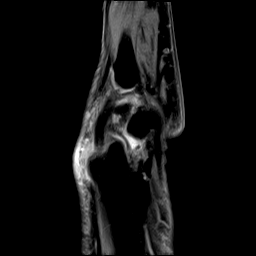
[im 20/20]
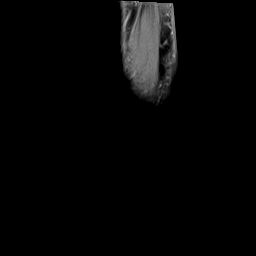

[Series 11: T1 · axial · right · 2.0mm · 0.47mm/px · z∈[+7,+62]mm · 9 of 25 slices shown (2 of 2)]
[im 1/25]
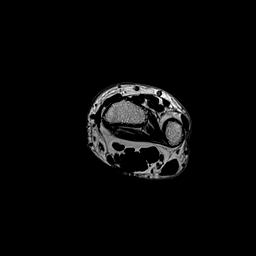
[im 4/25]
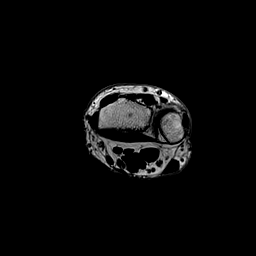
[im 7/25]
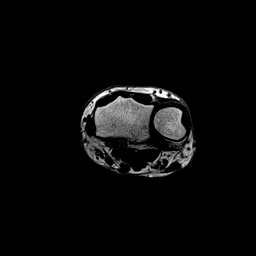
[im 10/25]
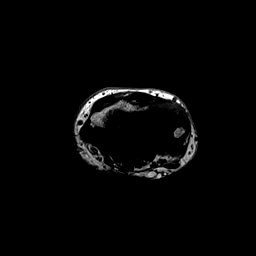
[im 13/25]
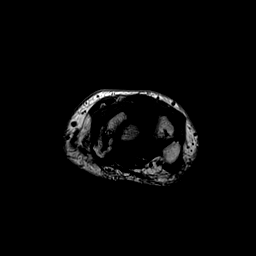
[im 16/25]
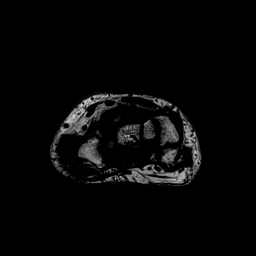
[im 19/25]
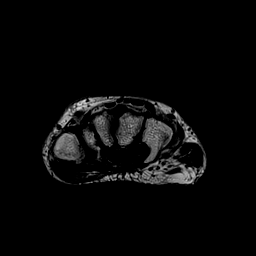
[im 22/25]
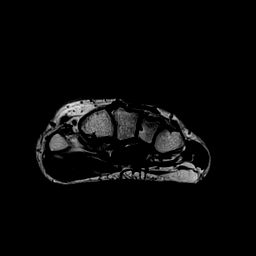
[im 25/25]
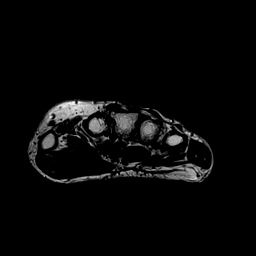

[40 of 40 positions shown; findings below may reference images not displayed]

FINDINGS: Ligaments: Scapholunate ligament appears somewhat thickened and
heterogeneous which may reflect degeneration. No well-defined tear
on non-arthrographic imaging. No gross disruption of the
lunotriquetral ligament.

Triangular fibrocartilage: Suspected tear of the articular disc of
the TFCC (series 9, image 5).

Tendons: Intact flexor and extensor compartment tendons without
tendinosis, tear, or tenosynovitis.

Carpal tunnel/median nerve: Normal carpal tunnel. Normal median
nerve.

Guyon's canal: Normal.

Joint/cartilage: Mild-moderate osteoarthritis of the first CMC and
triscaphe joints. Trace DRUJ effusion.

Bones/carpal alignment: Subacute nondisplaced fracture involving the
distal pole of the scaphoid (series 7, images 5-9) with mild edema
along the fracture margins. There is a small amount of fluid signal
within the fracture cleft. No abnormal marrow signal changes within
the proximal pole to suggest avascular necrosis. Diffusely abnormal
low T1 signal throughout the lunate with patchy increased T2 signal
compatible with osteonecrosis (series 7, image 5). Borderline
widening of the scapholunate interval. No dislocation. No additional
fractures.

Other: No soft tissue edema or fluid collection.
IMPRESSION: 1. Subacute nondisplaced fracture involving the distal pole of the
scaphoid. No evidence of scaphoid AVN.
2. Diffusely abnormal signal throughout the lunate compatible with
osteonecrosis (Kienbock disease).
3. Suspected tear of the articular disc of the TFCC.
4. Degeneration of the scapholunate interval with borderline
widening of the scapholunate interval.
5. Mild-moderate osteoarthritis of the first CMC and triscaphe
joints.

## 2023-02-07 ENCOUNTER — Ambulatory Visit
Admission: RE | Admit: 2023-02-07 | Discharge: 2023-02-07 | Disposition: A | Discharge status: Home / Self Care | Payer: Federal, State, Local not specified - PPO | Source: Ambulatory Visit | Attending: General Surgery | Admitting: General Surgery

## 2023-02-07 DIAGNOSIS — N611 Abscess of the breast and nipple: Secondary | ICD-10-CM | POA: Diagnosis present

## 2023-05-09 ENCOUNTER — Other Ambulatory Visit: Payer: Self-pay | Admitting: Dermatology

## 2023-05-09 DIAGNOSIS — L409 Psoriasis, unspecified: Secondary | ICD-10-CM

## 2023-07-02 ENCOUNTER — Ambulatory Visit: Payer: Federal, State, Local not specified - PPO | Admitting: Dermatology

## 2023-07-02 ENCOUNTER — Encounter: Payer: Self-pay | Admitting: Dermatology

## 2023-07-02 DIAGNOSIS — L814 Other melanin hyperpigmentation: Secondary | ICD-10-CM | POA: Diagnosis not present

## 2023-07-02 DIAGNOSIS — L820 Inflamed seborrheic keratosis: Secondary | ICD-10-CM | POA: Diagnosis not present

## 2023-07-02 DIAGNOSIS — Z79899 Other long term (current) drug therapy: Secondary | ICD-10-CM

## 2023-07-02 DIAGNOSIS — L409 Psoriasis, unspecified: Secondary | ICD-10-CM

## 2023-07-02 DIAGNOSIS — Z1283 Encounter for screening for malignant neoplasm of skin: Secondary | ICD-10-CM | POA: Diagnosis not present

## 2023-07-02 DIAGNOSIS — D229 Melanocytic nevi, unspecified: Secondary | ICD-10-CM | POA: Diagnosis not present

## 2023-07-02 DIAGNOSIS — Z7189 Other specified counseling: Secondary | ICD-10-CM

## 2023-07-02 DIAGNOSIS — L4050 Arthropathic psoriasis, unspecified: Secondary | ICD-10-CM

## 2023-07-02 DIAGNOSIS — L578 Other skin changes due to chronic exposure to nonionizing radiation: Secondary | ICD-10-CM

## 2023-07-02 NOTE — Patient Instructions (Addendum)

## 2023-07-02 NOTE — Progress Notes (Signed)
 Follow-Up Visit   Subjective  Sarah Stephenson is a 68 y.o. female who presents for the following: Skin Cancer Screening and Full Body Skin Exam  6 month follow up - Taltz  injections - she has noticed a small patches in her ears, knees and abdomen, using Mometasone  cream with a fair response.   The patient presents for Total-Body Skin Exam (TBSE) for skin cancer screening and mole check. The patient has spots, moles and lesions to be evaluated, some may be new or changing and the patient may have concern these could be cancer.  The following portions of the chart were reviewed this encounter and updated as appropriate: medications, allergies, medical history  Review of Systems:  No other skin or systemic complaints except as noted in HPI or Assessment and Plan.  Objective  Well appearing patient in no apparent distress; mood and affect are within normal limits.  A full examination was performed including scalp, head, eyes, ears, nose, lips, neck, chest, axillae, abdomen, back, buttocks, bilateral upper extremities, bilateral lower extremities, hands, feet, fingers, toes, fingernails, and toenails. All findings within normal limits unless otherwise noted below.   Relevant physical exam findings are noted in the Assessment and Plan.  left post shoulder x 1 Stuck-on, waxy, tan-brown papules and plaques -- Discussed benign etiology and prognosis.   Assessment & Plan   SKIN CANCER SCREENING PERFORMED TODAY.  ACTINIC DAMAGE - Chronic condition, secondary to cumulative UV/sun exposure - diffuse scaly erythematous macules with underlying dyspigmentation - Recommend daily broad spectrum sunscreen SPF 30+ to sun-exposed areas, reapply every 2 hours as needed.  - Staying in the shade or wearing long sleeves, sun glasses (UVA+UVB protection) and wide brim hats (4-inch brim around the entire circumference of the hat) are also recommended for sun protection.  - Call for new or changing  lesions.  LENTIGINES, SEBORRHEIC KERATOSES,  - Benign normal skin lesions - Benign-appearing - Call for any changes  MELANOCYTIC NEVI - Tan-brown and/or pink-flesh-colored symmetric macules and papules - Benign appearing on exam today - Observation - Call clinic for new or changing moles - Recommend daily use of broad spectrum spf 30+ sunscreen to sun-exposed areas.   Psoriasis And psoriatic arthritis on Systemic Treatment Counseling on psoriasis and coordination of care  psoriasis is a chronic non-curable, but treatable genetic/hereditary disease that may have other systemic features affecting other organ systems such as joints (Psoriatic Arthritis). It is associated with an increased risk of inflammatory bowel disease, heart disease, non-alcoholic fatty liver disease, and depression.  Treatments include light and laser treatments; topical medications; and systemic medications including oral and injectables.   Exam: pink scaly papules on the knees, umbilicus and ears 6% BSA.  Chronic and persistent condition with duration or expected duration over one year. Condition is symptomatic/ bothersome to patient. Not currently at goal.   patient report joint pain- knees  Patient will see Dr. Lydia Sams, rheumatologist August 13, 2023  Reviewed risks of biologics including immunosuppression, infections, injection site reaction, and failure to improve condition. Goal is control of skin condition, not cure.  Some older biologics such as Humira and Enbrel may slightly increase risk of malignancy and may worsen congestive heart failure.  Taltz  and Cosentyx may cause inflammatory bowel disease to flare. The use of biologics requires long term medication management, including periodic office visits and monitoring of blood work.   Continue Talz injections once monthly.  Continue  Mometasone  cream qd to any areas of flare prn TB skin 12/03/2022- negative  Patient will have labs drawn next month-ordered by Dr  Nickolas Barr term medication management.  Patient is using long term (months to years) prescription medication  to control their dermatologic condition.  These medications require periodic monitoring to evaluate for efficacy and side effects and may require periodic laboratory monitoring.    INFLAMED SEBORRHEIC KERATOSIS left post shoulder x 1 Symptomatic, irritating, patient would like treated.  Destruction of lesion - left post shoulder x 1 Complexity: simple   Destruction method: cryotherapy   Informed consent: discussed and consent obtained   Timeout:  patient name, date of birth, surgical site, and procedure verified Lesion destroyed using liquid nitrogen: Yes   Region frozen until ice ball extended beyond lesion: Yes   Outcome: patient tolerated procedure well with no complications   Post-procedure details: wound care instructions given     Return in about 6 months (around 01/02/2024) for Psoriasis .  IClara Crisp, CMA, am acting as scribe for Celine Collard, MD .   Documentation: I have reviewed the above documentation for accuracy and completeness, and I agree with the above.  Celine Collard, MD

## 2023-07-09 ENCOUNTER — Other Ambulatory Visit: Payer: Self-pay | Admitting: Dermatology

## 2023-07-09 DIAGNOSIS — L409 Psoriasis, unspecified: Secondary | ICD-10-CM

## 2023-09-04 ENCOUNTER — Other Ambulatory Visit: Payer: Self-pay | Admitting: Dermatology

## 2023-09-04 DIAGNOSIS — L409 Psoriasis, unspecified: Secondary | ICD-10-CM

## 2023-11-29 ENCOUNTER — Other Ambulatory Visit: Payer: Self-pay | Admitting: Internal Medicine

## 2023-11-29 DIAGNOSIS — K439 Ventral hernia without obstruction or gangrene: Secondary | ICD-10-CM

## 2023-11-29 DIAGNOSIS — Z0000 Encounter for general adult medical examination without abnormal findings: Secondary | ICD-10-CM

## 2023-11-29 DIAGNOSIS — R140 Abdominal distension (gaseous): Secondary | ICD-10-CM

## 2023-11-29 DIAGNOSIS — R413 Other amnesia: Secondary | ICD-10-CM

## 2023-12-10 ENCOUNTER — Ambulatory Visit
Admission: RE | Admit: 2023-12-10 | Discharge: 2023-12-10 | Disposition: A | Discharge status: Home / Self Care | Source: Ambulatory Visit | Attending: Internal Medicine | Admitting: Internal Medicine

## 2023-12-10 ENCOUNTER — Ambulatory Visit
Admission: RE | Admit: 2023-12-10 | Discharge: 2023-12-10 | Disposition: A | Source: Ambulatory Visit | Attending: Internal Medicine | Admitting: Internal Medicine

## 2023-12-10 DIAGNOSIS — K439 Ventral hernia without obstruction or gangrene: Secondary | ICD-10-CM

## 2023-12-10 DIAGNOSIS — R140 Abdominal distension (gaseous): Secondary | ICD-10-CM

## 2023-12-10 DIAGNOSIS — Z0000 Encounter for general adult medical examination without abnormal findings: Secondary | ICD-10-CM

## 2023-12-10 DIAGNOSIS — R413 Other amnesia: Secondary | ICD-10-CM | POA: Insufficient documentation

## 2023-12-10 MED ORDER — IOHEXOL 300 MG/ML  SOLN
100.0000 mL | Freq: Once | INTRAMUSCULAR | Status: AC | PRN
  Administered 2023-12-10: 100 mL via INTRAVENOUS

## 2023-12-19 ENCOUNTER — Other Ambulatory Visit: Payer: Self-pay | Admitting: Medical Genetics

## 2023-12-19 DIAGNOSIS — Z006 Encounter for examination for normal comparison and control in clinical research program: Secondary | ICD-10-CM

## 2024-01-07 ENCOUNTER — Ambulatory Visit: Admitting: Dermatology

## 2024-01-07 DIAGNOSIS — Z79899 Other long term (current) drug therapy: Secondary | ICD-10-CM | POA: Diagnosis not present

## 2024-01-07 DIAGNOSIS — L4050 Arthropathic psoriasis, unspecified: Secondary | ICD-10-CM | POA: Diagnosis not present

## 2024-01-07 DIAGNOSIS — Z7189 Other specified counseling: Secondary | ICD-10-CM | POA: Diagnosis not present

## 2024-01-07 DIAGNOSIS — L409 Psoriasis, unspecified: Secondary | ICD-10-CM | POA: Diagnosis not present

## 2024-01-07 NOTE — Progress Notes (Unsigned)
 Follow-Up Visit   Subjective  Sarah Stephenson is a 68 y.o. female who presents for the following: 6 month psoriasis followup Active flares on legs and arms  Seen rheumatologist last week and suggest other treatment Recommend tremfya  The following portions of the chart were reviewed this encounter and updated as appropriate: medications, allergies, medical history  Review of Systems:  No other skin or systemic complaints except as noted in HPI or Assessment and Plan.  Objective  Well appearing patient in no apparent distress; mood and affect are within normal limits.   A focused examination was performed of the following areas: Arms, legs, scalp, elbows   Relevant exam findings are noted in the Assessment and Plan. Psoriasis photos today at scalp  Psoriasis photos today at b/l arms  Psoriasis photos today at arms  Psoriasis photos today at arms  Psoriasis photos today at b/l elbows  Psoriasis photos today at b/l elbows  Psoriasis photos today at b/l lower legs / knees  Psoriasis photos today at left knee   Psoriasis photos today at left knee and lower leg  Psoriasis photos today at left knee and lower leg  Psoriasis photos today at left knee      Assessment & Plan   Psoriasis And psoriatic arthritis on Taltz  for 3 + years Now failing Taltz  Currently flared at skin and joint pain after 3 years of treatment on Taltz  Flared today at legs, arms see photos  Dr. Tobie and I agree Tremfya  Exam: pink scaly papules on the knees, arms, elbows  10% BSA. See photos  Chronic and persistent condition with duration or expected duration over one year. Condition is bothersome/symptomatic for patient. Currently flared.  Treatment Counseling on psoriasis and coordination of care  psoriasis is a chronic non-curable, but treatable genetic/hereditary disease that may have other systemic features affecting other organ systems such as joints (Psoriatic Arthritis). It is  associated with an increased risk of inflammatory bowel disease, heart disease, non-alcoholic fatty liver disease, and depression.  Treatments include light and laser treatments; topical medications; and systemic medications including oral and injectables.   patient report joint pain- knees  Reviewed risks of biologics including immunosuppression, infections, injection site reaction, and failure to improve condition. Goal is control of skin condition, not cure.  Some older biologics such as Humira and Enbrel may slightly increase risk of malignancy and may worsen congestive heart failure.  Taltz  and Cosentyx may cause inflammatory bowel disease to flare. The use of biologics requires long term medication management, including periodic office visits and monitoring of blood work.    Stopped Taltz  Reviewed previous labs CBC with Diff, CMP - OK  Hold Mometasone  cream qd to any areas of flare prn Pending TB test will start Tremfya   Long term medication management.  Patient is using long term (months to years) prescription medication  to control their dermatologic condition.  These medications require periodic monitoring to evaluate for efficacy and side effects and may require periodic laboratory monitoring.     PSORIASIS   Related Medications mometasone  (ELOCON ) 0.1 % cream Apply 1 Application topically daily as needed (Rash). TALTZ  80 MG/ML pen INJECT 1 PEN UNDER THE SKIN EVERY 4 WEEKS MEDICATION MANAGEMENT   Related Procedures QuantiFERON-TB Gold Plus LONG-TERM USE OF HIGH-RISK MEDICATION   Related Procedures QuantiFERON-TB Gold Plus  Return in about 6 months (around 07/06/2024) for psoriasis.    Documentation: I have reviewed the above documentation for accuracy and completeness, and I agree with  the above.  Alm Rhyme, MD

## 2024-01-07 NOTE — Patient Instructions (Signed)

## 2024-01-08 ENCOUNTER — Encounter: Payer: Self-pay | Admitting: Dermatology

## 2024-01-10 ENCOUNTER — Ambulatory Visit: Payer: Self-pay | Admitting: Dermatology

## 2024-01-10 LAB — QUANTIFERON-TB GOLD PLUS
QuantiFERON Mitogen Value: 1.92 [IU]/mL
QuantiFERON Nil Value: 0.01 [IU]/mL
QuantiFERON TB1 Ag Value: 0.01 [IU]/mL
QuantiFERON TB2 Ag Value: 0.01 [IU]/mL
QuantiFERON-TB Gold Plus: NEGATIVE

## 2024-01-13 MED ORDER — TREMFYA 100 MG/ML ~~LOC~~ SOSY: 100.0000 mg | PREFILLED_SYRINGE | SUBCUTANEOUS | 0 refills | Status: DC

## 2024-01-13 MED ORDER — TREMFYA ONE-PRESS 100 MG/ML ~~LOC~~ SOPN: 100.0000 mg | PEN_INJECTOR | SUBCUTANEOUS | 3 refills | Status: AC

## 2024-01-13 NOTE — Telephone Encounter (Signed)
 Called patient discussed TB skin test results negative. ] We will send Tremyfa to [facility] pharmacy

## 2024-01-13 NOTE — Telephone Encounter (Signed)
-----   Message from Alm Rhyme sent at 01/10/2024 12:27 PM EST ----- Lab from 01/08/2024 showed: Quantiferon Gold / TB test was NEGATIVE / Normal  Pt with Psoriasis and Psoriatic arthritis (pt failing Taltz  tx)  Send NEW Rx to START pt on Tremfya  to [facility] ----- Message ----- From: Rebecka Memos Lab Results In Sent: 01/10/2024   7:37 AM EST To: Alm JAYSON Rhyme, MD

## 2024-01-20 ENCOUNTER — Other Ambulatory Visit: Payer: Self-pay

## 2024-01-20 MED ORDER — TREMFYA ONE-PRESS 100 MG/ML ~~LOC~~ SOPN: 1.0000 mL | PEN_INJECTOR | SUBCUTANEOUS | 1 refills | Status: AC

## 2024-01-20 NOTE — Progress Notes (Signed)
 Original Tremfya  loading dose sent as syringe. Prescriptions needed to match. aw

## 2024-01-24 LAB — GENECONNECT MOLECULAR SCREEN

## 2024-01-27 ENCOUNTER — Telehealth: Payer: Self-pay | Admitting: Medical Genetics

## 2024-02-03 ENCOUNTER — Telehealth: Payer: Self-pay

## 2024-02-03 NOTE — Telephone Encounter (Signed)
 Her RX is going into appeals. More than likely insurance will be longer than next, especially with the holidays.

## 2024-02-03 NOTE — Telephone Encounter (Signed)
 This patient called she has not heard anything about her Tremyfa rx,   I discussed with patient a PA was sent to [facility] 01/24/24, can she get a sample of Tremyfa or Taltz -

## 2024-02-04 ENCOUNTER — Other Ambulatory Visit: Payer: Self-pay

## 2024-02-04 DIAGNOSIS — L409 Psoriasis, unspecified: Secondary | ICD-10-CM

## 2024-02-04 MED ORDER — MOMETASONE FUROATE 0.1 % EX CREA: 1.0000 | TOPICAL_CREAM | Freq: Every day | CUTANEOUS | 0 refills | Status: AC | PRN

## 2024-02-04 NOTE — Telephone Encounter (Signed)
 [facility]  02/04/2024 3:55 PM  Confirmed I was speaking with Elowyn Raupp [phone] by using name and DOB. Informed participant the reason for this call is to follow-up on a recent sample the participant provided at one of the [facility] lab locations. Informed participant the test was not able to be completed with this sample and apologized for the inconvenience. Participant was requested to provide a new sample at one of our participating labs at no cost so that participant can continue participation and receive test results. Informed participant they do not need to be fasting and if there are other samples that need to be drawn, they can be done at the same visit. Participant has not had a blood transfusion or blood product in the last 30 days. Participant agreed to provide another sample. Participant was provided the [employer] program website to learn why this may have happened. Participant was thanked for their time and continued support of the above study.    Jordyn Pennstrom, BS [place]  [facility] Clinical Research Specialist II Direct Dial: [phone]  Fax: [phone]

## 2024-02-04 NOTE — Progress Notes (Signed)
 Patient asking for RF of Mometasone  Cream while waiting for authorization. Aw

## 2024-02-04 NOTE — H&P (View-Only) (Signed)
 " History of Present Illness Agnes Brightbill is a 68 year old female with a history of partial colostomy due to diverticulitis who presents for evaluation of incisional hernia and discussion of surgical intervention.  She has a history of partial colostomy creation due to diverticulitis in 2018. She developed a midline hernia and a hernia from the previous colostomy scar. A CT scan of the abdomen and pelvis, performed on February 20, 2023, showed the presence of incisional hernias with small and large intestine involvement.  She has psoriasis, which is particularly severe around her abdomen and navel area. She has been using topical treatments and was previously on Taltz . She is awaiting insurance approval for Tremfya , a new medication for her psoriasis. The psoriasis has been described as looking like 'burns' and has been difficult to manage, affecting her ability to wear pants comfortably.  She is a smoker and is attempting to quit smoking in preparation for the upcoming surgery. She is also working on losing weight and managing her blood pressure.      PAST MEDICAL HISTORY:  Past Medical History:  Diagnosis Date   Allergy 1982   Medications  Seasonal   Anxiety    Intermittent   Arthritis 2015   Psoriatic   Bronchitis, chronic (CMS/HHS-HCC)    Depression 1993   Post partum,bereavement   GERD (gastroesophageal reflux disease) Occasionally   Hyperlipidemia 2023   Obesity    Sinusitis, unspecified    Sleep apnea    Thyroid  disease         PAST SURGICAL HISTORY:   Past Surgical History:  Procedure Laterality Date   COLON SURGERY  02/26/2016   Colostomy/diverticulitis   TONSILLECTOMY  1964         MEDICATIONS:  Outpatient Encounter Medications as of 02/04/2024  Medication Sig Dispense Refill   amLODIPine  (NORVASC ) 5 MG tablet Take 1 tablet (5 mg total) by mouth once daily 30 tablet 1   buPROPion  (WELLBUTRIN  XL) 150 MG XL tablet Take 1 tablet (150 mg total) by mouth  once daily 30 tablet 1   esomeprazole magnesium (NEXIUM ORAL) Take by mouth once daily as needed     ezetimibe (ZETIA) 10 mg tablet Take 1 tablet (10 mg total) by mouth once daily 30 tablet 11   guselkumab  (TREMFYA  ONE-PRESS) 100 mg/mL auto-injector Inject 1 mL subcutaneously     ibuprofen (MOTRIN) 600 MG tablet      levothyroxine  (SYNTHROID ) 137 MCG tablet TAKE 1 TABLET BY MOUTH EVERY MORNING BEFORE BREAKFAST, EMPTY STOMACH WITH GLASS OF WATER  BEFORE BREAKFAST 90 tablet 1   ixekizumab  (TALTZ  AUTOINJECTOR) 80 mg/mL AtIn Inject subcutaneously monthly (Patient not taking: Reported on 02/04/2024)     No facility-administered encounter medications on file as of 02/04/2024.     ALLERGIES:   Oxycodone , Sulfa (sulfonamide antibiotics), and Venlafaxine   SOCIAL HISTORY:  Social History   Socioeconomic History   Marital status: Married  Occupational History   Occupation: Working at [employer]  Tobacco Use   Smoking status: Every Day    Current packs/day: 0.50    Types: Cigarettes    Passive exposure: Current   Smokeless tobacco: Never  Vaping Use   Vaping status: Never Used  Substance and Sexual Activity   Alcohol use: Yes    Alcohol/week: 9.0 standard drinks of alcohol    Types: 9 Cans of beer per week   Drug use: Never   Sexual activity: Yes    Partners: Male    Birth control/protection: Post-menopausal  Social Drivers of Corporate Investment Banker Strain: Low Risk  (01/08/2024)   Overall Financial Resource Strain (CARDIA)    Difficulty of Paying Living Expenses: Not hard at all  Food Insecurity: No Food Insecurity (01/08/2024)   Hunger Vital Sign    Worried About Running Out of Food in the Last Year: Never true    Ran Out of Food in the Last Year: Never true  Transportation Needs: No Transportation Needs (01/08/2024)   PRAPARE - Administrator, Civil Service (Medical): No    Lack of Transportation (Non-Medical): No    FAMILY HISTORY:   Family History  Problem Relation Name Age of Onset   Arthritis Father Clem    Alcohol abuse Father Clem    Hyperlipidemia (Elevated cholesterol) Father Albert    High blood pressure (Hypertension) Mother Leonie    Osteoporosis (Thinning of bones) Mother Leonie    Thyroid  disease Mother Leonie    Alcohol abuse Son Oliva    Anxiety Son Oliva    Depression Son Oliva    Anxiety Daughter Damien    Depression Daughter Damien    Anxiety Daughter Reche    Thyroid  disease Brother Lamar    Thyroid  disease Sister Administrator, Sports      GENERAL REVIEW OF SYSTEMS:   General ROS: negative for - chills, fatigue, fever, weight gain or weight loss Allergy and Immunology ROS: negative for - hives  Hematological and Lymphatic ROS: negative for - bleeding problems or bruising, negative for palpable nodes Endocrine ROS: negative for - heat or cold intolerance, hair changes Respiratory ROS: negative for - cough, shortness of breath or wheezing Cardiovascular ROS: no chest pain or palpitations GI ROS: negative for nausea, vomiting, abdominal pain, diarrhea, constipation Musculoskeletal ROS: negative for - joint swelling or muscle pain Neurological ROS: negative for - confusion, syncope Dermatological ROS: negative for pruritus and rash  PHYSICAL EXAM:  Vitals:   02/04/24 0847  BP: (!) 153/78  Pulse: 80  .  Ht:170.2 cm (5' 7) Wt:97.1 kg (214 lb) ADJ:Anib surface area is 2.14 meters squared. Body mass index is 33.52 kg/m.SABRA   GENERAL: Alert, active, oriented x3  HEENT: Pupils equal reactive to light. Extraocular movements are intact. Sclera clear. Palpebral conjunctiva normal red color.Pharynx clear.  NECK: Supple with no palpable mass and no adenopathy.  LUNGS: Sound clear with no rales rhonchi or wheezes.  HEART: Regular rhythm S1 and S2 without murmur.  ABDOMEN: Soft and depressible, nontender with no palpable mass, no hepatomegaly.  Large palpable incisional hernia at midline  and left lower quadrant.  Not able to be reduced.  EXTREMITIES: Well-developed well-nourished symmetrical with no dependent edema.  NEUROLOGICAL: Awake alert oriented, facial expression symmetrical, moving all extremities.   Results Radiology Abdomen and pelvis CT (11/2023): Incisional hernia at midline and left colostomy scar with herniation of small and large bowel through both defects    Assessment & Plan Incisional hernias of the abdominal wall   Two large incisional hernias are present at the umbilicus and the midline scar from a previous colostomy, with CT scan showing intestinal protrusion. Surgical repair is necessary to reconstruct the abdominal wall and place a mesh. Smoking cessation is critical to reduce infection risk and improve healing. Psoriasis and scar tissue may complicate the surgical site, potentially requiring umbilicus removal to reduce infection risk. She is aware of the risks and is prepared for surgery in mid-January. Surgical repair is scheduled for mid-January. Complete smoking cessation is advised to reduce surgical  risks. Potential removal of the umbilicus during surgery has been discussed. She is instructed on post-operative care, including drain management and pain control. Increased dietary protein intake is advised to aid healing. A hospital stay of 2-3 days post-surgery is planned, depending on pain management and recovery. The potential use of an abdominal binder post-surgery for support has been discussed. No lifting is advised for six weeks post-surgery. Work leave and disability paperwork have been discussed.   Incisional hernia, without obstruction or gangrene [K43.2]          Patient verbalized understanding, all questions were answered, and were agreeable with the plan outlined above.   Lucas Sjogren, MD  Electronically signed by Lucas Sjogren, MD "

## 2024-02-06 ENCOUNTER — Ambulatory Visit: Payer: Self-pay | Admitting: General Surgery

## 2024-02-07 ENCOUNTER — Encounter
Admission: RE | Admit: 2024-02-07 | Discharge: 2024-02-07 | Disposition: A | Discharge status: Home / Self Care | Source: Ambulatory Visit | Attending: General Surgery | Admitting: General Surgery

## 2024-02-07 ENCOUNTER — Other Ambulatory Visit: Payer: Self-pay

## 2024-02-07 DIAGNOSIS — K439 Ventral hernia without obstruction or gangrene: Secondary | ICD-10-CM

## 2024-02-07 DIAGNOSIS — Z01810 Encounter for preprocedural cardiovascular examination: Secondary | ICD-10-CM

## 2024-02-07 HISTORY — DX: Pneumonia, unspecified organism: J18.9

## 2024-02-07 HISTORY — DX: Anemia, unspecified: D64.9

## 2024-02-07 HISTORY — DX: Dyspnea, unspecified: R06.00

## 2024-02-07 HISTORY — DX: Essential (primary) hypertension: I10

## 2024-02-07 HISTORY — DX: Gastro-esophageal reflux disease without esophagitis: K21.9

## 2024-02-07 HISTORY — DX: Arthropathic psoriasis, unspecified: L40.50

## 2024-02-07 HISTORY — DX: Headache, unspecified: R51.9

## 2024-02-07 NOTE — Patient Instructions (Addendum)
 Your procedure is scheduled on: 02/26/24 - Wednesday Report to the Registration Desk on the 1st floor of the [facility]. To find out your arrival time, please call [phone] between 1PM - 3PM on: 02/25/24 - Tuesday If your arrival time is 6:00 am, do not arrive before that time as the [facility] Mall entrance doors do not open until 6:00 am.  REMEMBER: Instructions that are not followed completely may result in serious medical risk, up to and including death; or upon the discretion of your surgeon and anesthesiologist your surgery may need to be rescheduled.  Do not eat food or drink any liquids after midnight the night before surgery.  No gum chewing or hard candies.  One week prior to surgery: Stop Anti-inflammatories (NSAIDS) such as Advil, Aleve, Ibuprofen, Motrin, Naproxen, Naprosyn and Aspirin based products such as Excedrin, Goody's Powder, BC Powder. You may take Tylenol if needed for pain up until the day of surgery.  Stop ANY OVER THE COUNTER supplements until after surgery.  ON THE DAY OF SURGERY ONLY TAKE THESE MEDICATIONS WITH SIPS OF WATER:  amLODipine (NORVASC) buPROPion (WELLBUTRIN )  levothyroxine  (SYNTHROID )     No Alcohol for 24 hours before or after surgery.  No Smoking including e-cigarettes for 24 hours before surgery.  No chewable tobacco products for at least 6 hours before surgery.  No nicotine patches on the day of surgery.  Do not use any recreational drugs for at least a week (preferably 2 weeks) before your surgery.  Please be advised that the combination of cocaine and anesthesia may have negative outcomes, up to and including death. If you test positive for cocaine, your surgery will be cancelled.  On the morning of surgery brush your teeth with toothpaste and water, you may rinse your mouth with mouthwash if you wish. Do not swallow any toothpaste or mouthwash.  Use CHG Soap or wipes as directed on instruction sheet.  Do not wear jewelry,  make-up, hairpins, clips or nail polish.  For welded (permanent) jewelry: bracelets, anklets, waist bands, etc.  Please have this removed prior to surgery.  If it is not removed, there is a chance that hospital personnel will need to cut it off on the day of surgery.  Do not wear lotions, powders, or perfumes.   Do not shave body hair from the neck down 48 hours before surgery.  Contact lenses, hearing aids and dentures may not be worn into surgery.  Do not bring valuables to the hospital. [facility] is not responsible for any missing/lost belongings or valuables.   Notify your doctor if there is any change in your medical condition (cold, fever, infection).  Wear comfortable clothing (specific to your surgery type) to the hospital.  After surgery, you can help prevent lung complications by doing breathing exercises.  Take deep breaths and cough every 1-2 hours. Your doctor may order a device called an Incentive Spirometer to help you take deep breaths.  When coughing or sneezing, hold a pillow firmly against your incision with both hands. This is called splinting. Doing this helps protect your incision. It also decreases belly discomfort.  If you are being admitted to the hospital overnight, leave your suitcase in the car. After surgery it may be brought to your room.  In case of increased patient census, it may be necessary for you, the patient, to continue your postoperative care in the Same Day Surgery department.  If you are being discharged the day of surgery, you will not  be allowed to drive home. You will need a responsible individual to drive you home and stay with you for 24 hours after surgery.   If you are taking public transportation, you will need to have a responsible individual with you.  Please call the Pre-admissions Testing Dept. at [phone] if you have any questions about these instructions.  Surgery Visitation Policy:  Patients having surgery or a  procedure may have two visitors.  Children under the age of 32 must have an adult with them who is not the patient.  Inpatient Visitation:    Visiting hours are 7 a.m. to 8 p.m. Up to four visitors are allowed at one time in a patient room. The visitors may rotate out with other people during the day.  One visitor age 35 or older may stay with the patient overnight and must be in the room by 8 p.m.   Merchandiser, Retail to address health-related social needs:  [email or web address]                                                                                                            Preparing for Surgery with CHLORHEXIDINE GLUCONATE (CHG) Soap  Chlorhexidine Gluconate (CHG) Soap  o An antiseptic cleaner that kills germs and bonds with the skin to continue killing germs even after washing  o Used for showering the night before surgery and morning of surgery  Before surgery, you can play an important role by reducing the number of germs on your skin.  CHG (Chlorhexidine gluconate) soap is an antiseptic cleanser which kills germs and bonds with the skin to continue killing germs even after washing.  Please do not use if you have an allergy to CHG or antibacterial soaps. If your skin becomes reddened/irritated stop using the CHG.  1. Shower the NIGHT BEFORE SURGERY with CHG soap.  2. If you choose to wash your hair, wash your hair first as usual with your normal shampoo.  3. After shampooing, rinse your hair and body thoroughly to remove the shampoo.  4. Use CHG as you would any other liquid soap. You can apply CHG directly to the skin and wash gently with a clean washcloth.  5. Apply the CHG soap to your body only from the neck down. Do not use on open wounds or open sores. Avoid contact with your eyes, ears, mouth, and genitals (private parts). Wash face and genitals (private parts) with your normal soap.  6. Wash thoroughly, paying special attention to the  area where your surgery will be performed.  7. Thoroughly rinse your body with warm water.  8. Do not shower/wash with your normal soap after using and rinsing off the CHG soap.  9. Do not use lotions, oils, etc., after showering with CHG.  10. Pat yourself dry with a clean towel.  11. Wear clean pajamas to bed the night before surgery.  12. Place clean sheets on your bed the night of your shower and do not sleep with pets.  13. Do not apply any deodorants/lotions/powders.  14. Please wear clean clothes to the hospital.  15. Remember to brush your teeth with your regular toothpaste.

## 2024-02-10 ENCOUNTER — Other Ambulatory Visit: Payer: Self-pay | Admitting: Medical Genetics

## 2024-02-10 DIAGNOSIS — Z006 Encounter for examination for normal comparison and control in clinical research program: Secondary | ICD-10-CM

## 2024-02-17 ENCOUNTER — Encounter
Admission: RE | Admit: 2024-02-17 | Discharge: 2024-02-17 | Disposition: A | Discharge status: Home / Self Care | Source: Ambulatory Visit | Attending: General Surgery | Admitting: General Surgery

## 2024-02-17 ENCOUNTER — Other Ambulatory Visit
Admission: RE | Admit: 2024-02-17 | Discharge: 2024-02-17 | Disposition: A | Discharge status: Home / Self Care | Payer: Self-pay | Source: Ambulatory Visit | Attending: Medical Genetics | Admitting: Medical Genetics

## 2024-02-17 DIAGNOSIS — Z01810 Encounter for preprocedural cardiovascular examination: Secondary | ICD-10-CM | POA: Diagnosis present

## 2024-02-17 DIAGNOSIS — Z006 Encounter for examination for normal comparison and control in clinical research program: Secondary | ICD-10-CM | POA: Insufficient documentation

## 2024-02-26 ENCOUNTER — Other Ambulatory Visit: Payer: Self-pay

## 2024-02-26 ENCOUNTER — Inpatient Hospital Stay: Payer: Self-pay | Admitting: Urgent Care

## 2024-02-26 ENCOUNTER — Encounter: Payer: Self-pay | Admitting: General Surgery

## 2024-02-26 ENCOUNTER — Inpatient Hospital Stay
Admission: RE | Admit: 2024-02-26 | Discharge: 2024-03-01 | DRG: 337 | Disposition: A | Discharge status: Home / Self Care | Source: Ambulatory Visit | Attending: General Surgery | Admitting: General Surgery

## 2024-02-26 ENCOUNTER — Inpatient Hospital Stay: Admitting: Certified Registered Nurse Anesthetist

## 2024-02-26 ENCOUNTER — Encounter
Admission: RE | Disposition: A | Discharge status: Home / Self Care | Payer: Self-pay | Source: Ambulatory Visit | Attending: General Surgery

## 2024-02-26 DIAGNOSIS — Z818 Family history of other mental and behavioral disorders: Secondary | ICD-10-CM | POA: Diagnosis not present

## 2024-02-26 DIAGNOSIS — Z8261 Family history of arthritis: Secondary | ICD-10-CM | POA: Diagnosis not present

## 2024-02-26 DIAGNOSIS — Z79890 Hormone replacement therapy (postmenopausal): Secondary | ICD-10-CM | POA: Diagnosis not present

## 2024-02-26 DIAGNOSIS — Z811 Family history of alcohol abuse and dependence: Secondary | ICD-10-CM

## 2024-02-26 DIAGNOSIS — F17210 Nicotine dependence, cigarettes, uncomplicated: Secondary | ICD-10-CM | POA: Diagnosis present

## 2024-02-26 DIAGNOSIS — G4730 Sleep apnea, unspecified: Secondary | ICD-10-CM | POA: Diagnosis present

## 2024-02-26 DIAGNOSIS — E785 Hyperlipidemia, unspecified: Secondary | ICD-10-CM | POA: Diagnosis present

## 2024-02-26 DIAGNOSIS — Z8349 Family history of other endocrine, nutritional and metabolic diseases: Secondary | ICD-10-CM

## 2024-02-26 DIAGNOSIS — E079 Disorder of thyroid, unspecified: Secondary | ICD-10-CM | POA: Diagnosis present

## 2024-02-26 DIAGNOSIS — L4050 Arthropathic psoriasis, unspecified: Secondary | ICD-10-CM | POA: Diagnosis present

## 2024-02-26 DIAGNOSIS — K432 Incisional hernia without obstruction or gangrene: Secondary | ICD-10-CM | POA: Diagnosis present

## 2024-02-26 DIAGNOSIS — J42 Unspecified chronic bronchitis: Secondary | ICD-10-CM | POA: Diagnosis present

## 2024-02-26 DIAGNOSIS — Z8249 Family history of ischemic heart disease and other diseases of the circulatory system: Secondary | ICD-10-CM

## 2024-02-26 DIAGNOSIS — Z79899 Other long term (current) drug therapy: Secondary | ICD-10-CM

## 2024-02-26 HISTORY — PX: VENTRAL HERNIA REPAIR: SHX424

## 2024-02-26 HISTORY — PX: INSERTION OF MESH: SHX5868

## 2024-02-26 MED ORDER — MORPHINE SULFATE (PF) 4 MG/ML IV SOLN
4.0000 mg | INTRAVENOUS | Status: DC | PRN
  Filled 2024-02-26: qty 1

## 2024-02-26 MED ORDER — ALUM & MAG HYDROXIDE-SIMETH 200-200-20 MG/5ML PO SUSP
30.0000 mL | Freq: Four times a day (QID) | ORAL | Status: AC | PRN
  Administered 2024-02-26: 30 mL via ORAL
  Filled 2024-02-26: qty 30

## 2024-02-26 MED ORDER — ACETAMINOPHEN 10 MG/ML IV SOLN
INTRAVENOUS | Status: AC
  Filled 2024-02-26: qty 100

## 2024-02-26 MED ORDER — MIDAZOLAM HCL 2 MG/2ML IJ SOLN
INTRAMUSCULAR | Status: AC
  Filled 2024-02-26: qty 2

## 2024-02-26 MED ORDER — BUPIVACAINE-EPINEPHRINE (PF) 0.25% -1:200000 IJ SOLN
INTRAMUSCULAR | Status: AC
  Filled 2024-02-26: qty 30

## 2024-02-26 MED ORDER — ONDANSETRON HCL 4 MG/2ML IJ SOLN
INTRAMUSCULAR | Status: DC | PRN
  Administered 2024-02-26: 4 mg via INTRAVENOUS

## 2024-02-26 MED ORDER — SODIUM CHLORIDE (PF) 0.9 % IJ SOLN
INTRAMUSCULAR | Status: AC
  Filled 2024-02-26: qty 50

## 2024-02-26 MED ORDER — LIDOCAINE HCL (PF) 2 % IJ SOLN
INTRAMUSCULAR | Status: AC
  Filled 2024-02-26: qty 5

## 2024-02-26 MED ORDER — CHLORHEXIDINE GLUCONATE 0.12 % MT SOLN
OROMUCOSAL | Status: AC
  Filled 2024-02-26: qty 15

## 2024-02-26 MED ORDER — HYDROMORPHONE HCL 1 MG/ML IJ SOLN
INTRAMUSCULAR | Status: AC
  Filled 2024-02-26: qty 1

## 2024-02-26 MED ORDER — ALBUTEROL SULFATE HFA 108 (90 BASE) MCG/ACT IN AERS
INHALATION_SPRAY | RESPIRATORY_TRACT | Status: DC | PRN
  Administered 2024-02-26: 5 via RESPIRATORY_TRACT

## 2024-02-26 MED ORDER — ACETAMINOPHEN 500 MG PO TABS
1000.0000 mg | ORAL_TABLET | ORAL | Status: AC
  Administered 2024-02-26: 1000 mg via ORAL

## 2024-02-26 MED ORDER — SODIUM CHLORIDE (PF) 0.9 % IJ SOLN
INTRAMUSCULAR | Status: DC | PRN
  Administered 2024-02-26: 100 mL via INTRAMUSCULAR

## 2024-02-26 MED ORDER — ALBUTEROL SULFATE HFA 108 (90 BASE) MCG/ACT IN AERS
INHALATION_SPRAY | RESPIRATORY_TRACT | Status: AC
  Filled 2024-02-26: qty 6.7

## 2024-02-26 MED ORDER — LACTATED RINGERS IV SOLN: INTRAVENOUS | Status: DC

## 2024-02-26 MED ORDER — AMLODIPINE BESYLATE 5 MG PO TABS
5.0000 mg | ORAL_TABLET | Freq: Every day | ORAL | Status: DC
  Administered 2024-02-27 – 2024-03-01 (×4): 5 mg via ORAL
  Filled 2024-02-26 (×5): qty 1

## 2024-02-26 MED ORDER — FENTANYL CITRATE (PF) 100 MCG/2ML IJ SOLN
INTRAMUSCULAR | Status: AC
  Filled 2024-02-26: qty 2

## 2024-02-26 MED ORDER — CEFAZOLIN SODIUM-DEXTROSE 2-4 GM/100ML-% IV SOLN
2.0000 g | INTRAVENOUS | Status: AC
  Administered 2024-02-26: 2 g via INTRAVENOUS

## 2024-02-26 MED ORDER — POLYETHYLENE GLYCOL 3350 17 G PO PACK
17.0000 g | PACK | Freq: Two times a day (BID) | ORAL | Status: DC
  Administered 2024-02-26 – 2024-03-01 (×9): 17 g via ORAL
  Filled 2024-02-26 (×10): qty 1

## 2024-02-26 MED ORDER — ACETAMINOPHEN 10 MG/ML IV SOLN
1000.0000 mg | Freq: Once | INTRAVENOUS | Status: DC | PRN
  Administered 2024-02-26: 1000 mg via INTRAVENOUS

## 2024-02-26 MED ORDER — OXYCODONE HCL 5 MG/5ML PO SOLN: 5.0000 mg | Freq: Once | ORAL | Status: DC | PRN

## 2024-02-26 MED ORDER — PANTOPRAZOLE SODIUM 40 MG PO TBEC
40.0000 mg | DELAYED_RELEASE_TABLET | Freq: Every day | ORAL | Status: DC
  Administered 2024-02-26 – 2024-03-01 (×5): 40 mg via ORAL
  Filled 2024-02-26 (×6): qty 1

## 2024-02-26 MED ORDER — ORAL CARE MOUTH RINSE: 15.0000 mL | Freq: Once | OROMUCOSAL | Status: AC

## 2024-02-26 MED ORDER — ENOXAPARIN SODIUM 60 MG/0.6ML IJ SOSY
0.5000 mg/kg | PREFILLED_SYRINGE | INTRAMUSCULAR | Status: DC
  Administered 2024-02-27 – 2024-03-01 (×4): 47.5 mg via SUBCUTANEOUS
  Filled 2024-02-26 (×5): qty 0.6

## 2024-02-26 MED ORDER — LIDOCAINE HCL (CARDIAC) PF 100 MG/5ML IV SOSY
PREFILLED_SYRINGE | INTRAVENOUS | Status: DC | PRN
  Administered 2024-02-26: 100 mg via INTRAVENOUS

## 2024-02-26 MED ORDER — ACETAMINOPHEN 500 MG PO TABS
ORAL_TABLET | ORAL | Status: AC
  Filled 2024-02-26: qty 2

## 2024-02-26 MED ORDER — ROCURONIUM BROMIDE 10 MG/ML (PF) SYRINGE
PREFILLED_SYRINGE | INTRAVENOUS | Status: AC
  Filled 2024-02-26: qty 10

## 2024-02-26 MED ORDER — ROCURONIUM BROMIDE 100 MG/10ML IV SOLN
INTRAVENOUS | Status: DC | PRN
  Administered 2024-02-26: 20 mg via INTRAVENOUS
  Administered 2024-02-26: 50 mg via INTRAVENOUS
  Administered 2024-02-26: 30 mg via INTRAVENOUS
  Administered 2024-02-26 (×2): 20 mg via INTRAVENOUS

## 2024-02-26 MED ORDER — CELECOXIB 200 MG PO CAPS
200.0000 mg | ORAL_CAPSULE | Freq: Two times a day (BID) | ORAL | Status: DC
  Administered 2024-02-26 – 2024-03-01 (×8): 200 mg via ORAL
  Filled 2024-02-26 (×9): qty 1

## 2024-02-26 MED ORDER — PHENYLEPHRINE 80 MCG/ML (10ML) SYRINGE FOR IV PUSH (FOR BLOOD PRESSURE SUPPORT)
PREFILLED_SYRINGE | INTRAVENOUS | Status: DC | PRN
  Administered 2024-02-26: 160 ug via INTRAVENOUS

## 2024-02-26 MED ORDER — HYDROCODONE-ACETAMINOPHEN 5-325 MG PO TABS
1.0000 | ORAL_TABLET | ORAL | Status: DC | PRN
  Administered 2024-02-26 – 2024-03-01 (×12): 2 via ORAL
  Filled 2024-02-26 (×13): qty 2

## 2024-02-26 MED ORDER — BUPIVACAINE LIPOSOME 1.3 % IJ SUSP
INTRAMUSCULAR | Status: AC
  Filled 2024-02-26: qty 20

## 2024-02-26 MED ORDER — MOMETASONE FUROATE 0.1 % EX CREA: 1.0000 | TOPICAL_CREAM | Freq: Every day | CUTANEOUS | Status: DC | PRN

## 2024-02-26 MED ORDER — DEXAMETHASONE SOD PHOSPHATE PF 10 MG/ML IJ SOLN
INTRAMUSCULAR | Status: AC
  Filled 2024-02-26: qty 1

## 2024-02-26 MED ORDER — KETOROLAC TROMETHAMINE 30 MG/ML IJ SOLN
INTRAMUSCULAR | Status: DC | PRN
  Administered 2024-02-26: 30 mg via INTRAVENOUS

## 2024-02-26 MED ORDER — GABAPENTIN 300 MG PO CAPS
ORAL_CAPSULE | ORAL | Status: AC
  Filled 2024-02-26: qty 1

## 2024-02-26 MED ORDER — EPHEDRINE SULFATE-NACL 50-0.9 MG/10ML-% IV SOSY
PREFILLED_SYRINGE | INTRAVENOUS | Status: DC | PRN
  Administered 2024-02-26 (×2): 10 mg via INTRAVENOUS

## 2024-02-26 MED ORDER — IRRISEPT - 450ML BOTTLE WITH 0.05% CHG IN STERILE WATER, USP 99.95% OPTIME
TOPICAL | Status: DC | PRN
  Administered 2024-02-26: 450 mL

## 2024-02-26 MED ORDER — CELECOXIB 200 MG PO CAPS
ORAL_CAPSULE | ORAL | Status: AC
  Filled 2024-02-26: qty 1

## 2024-02-26 MED ORDER — OXYCODONE HCL 5 MG PO TABS: 5.0000 mg | ORAL_TABLET | Freq: Once | ORAL | Status: DC | PRN

## 2024-02-26 MED ORDER — BUPROPION HCL ER (XL) 150 MG PO TB24
150.0000 mg | ORAL_TABLET | Freq: Every day | ORAL | Status: DC
  Administered 2024-02-27 – 2024-03-01 (×4): 150 mg via ORAL
  Filled 2024-02-26 (×4): qty 1

## 2024-02-26 MED ORDER — HYDROMORPHONE HCL 1 MG/ML IJ SOLN
INTRAMUSCULAR | Status: DC | PRN
  Administered 2024-02-26: .5 mg via INTRAVENOUS

## 2024-02-26 MED ORDER — LEVOTHYROXINE SODIUM 137 MCG PO TABS
137.0000 ug | ORAL_TABLET | Freq: Every day | ORAL | Status: DC
  Administered 2024-02-27 – 2024-03-01 (×4): 137 ug via ORAL
  Filled 2024-02-26 (×4): qty 1

## 2024-02-26 MED ORDER — DROPERIDOL 2.5 MG/ML IJ SOLN: 0.6250 mg | Freq: Once | INTRAMUSCULAR | Status: DC | PRN

## 2024-02-26 MED ORDER — GABAPENTIN 300 MG PO CAPS
300.0000 mg | ORAL_CAPSULE | Freq: Two times a day (BID) | ORAL | Status: DC
  Administered 2024-02-26 – 2024-03-01 (×8): 300 mg via ORAL
  Filled 2024-02-26: qty 1
  Filled 2024-02-26: qty 3
  Filled 2024-02-26 (×2): qty 1
  Filled 2024-02-26: qty 3
  Filled 2024-02-26 (×2): qty 1
  Filled 2024-02-26: qty 3
  Filled 2024-02-26: qty 1

## 2024-02-26 MED ORDER — FENTANYL CITRATE (PF) 100 MCG/2ML IJ SOLN
INTRAMUSCULAR | Status: DC | PRN
  Administered 2024-02-26 (×2): 50 ug via INTRAVENOUS

## 2024-02-26 MED ORDER — FENTANYL CITRATE (PF) 100 MCG/2ML IJ SOLN
25.0000 ug | INTRAMUSCULAR | Status: DC | PRN
  Administered 2024-02-26 (×4): 25 ug via INTRAVENOUS

## 2024-02-26 MED ORDER — METHOCARBAMOL 500 MG PO TABS
500.0000 mg | ORAL_TABLET | Freq: Four times a day (QID) | ORAL | Status: DC | PRN
  Administered 2024-02-26 – 2024-02-27 (×3): 500 mg via ORAL
  Filled 2024-02-26 (×3): qty 1

## 2024-02-26 MED ORDER — CHLORHEXIDINE GLUCONATE 0.12 % MT SOLN
15.0000 mL | Freq: Once | OROMUCOSAL | Status: AC
  Administered 2024-02-26: 15 mL via OROMUCOSAL

## 2024-02-26 MED ORDER — MIDAZOLAM HCL (PF) 2 MG/2ML IJ SOLN
INTRAMUSCULAR | Status: DC | PRN
  Administered 2024-02-26 (×2): 1 mg via INTRAVENOUS

## 2024-02-26 MED ORDER — ONDANSETRON 4 MG PO TBDP: 4.0000 mg | ORAL_TABLET | Freq: Four times a day (QID) | ORAL | Status: DC | PRN

## 2024-02-26 MED ORDER — CELECOXIB 200 MG PO CAPS
200.0000 mg | ORAL_CAPSULE | ORAL | Status: AC
  Administered 2024-02-26: 200 mg via ORAL

## 2024-02-26 MED ORDER — SUGAMMADEX SODIUM 200 MG/2ML IV SOLN
INTRAVENOUS | Status: DC | PRN
  Administered 2024-02-26: 200 mg via INTRAVENOUS

## 2024-02-26 MED ORDER — GABAPENTIN 300 MG PO CAPS
300.0000 mg | ORAL_CAPSULE | ORAL | Status: AC
  Administered 2024-02-26: 300 mg via ORAL

## 2024-02-26 MED ORDER — PROPOFOL 10 MG/ML IV BOLUS
INTRAVENOUS | Status: DC | PRN
  Administered 2024-02-26: 140 mg via INTRAVENOUS

## 2024-02-26 MED ORDER — DEXAMETHASONE SOD PHOSPHATE PF 10 MG/ML IJ SOLN
INTRAMUSCULAR | Status: DC | PRN
  Administered 2024-02-26: 10 mg via INTRAVENOUS

## 2024-02-26 MED ORDER — ONDANSETRON HCL 4 MG/2ML IJ SOLN: 4.0000 mg | Freq: Four times a day (QID) | INTRAMUSCULAR | Status: DC | PRN

## 2024-02-26 MED ORDER — PROPOFOL 10 MG/ML IV BOLUS
INTRAVENOUS | Status: AC
  Filled 2024-02-26: qty 20

## 2024-02-26 MED ORDER — CEFAZOLIN SODIUM-DEXTROSE 2-4 GM/100ML-% IV SOLN
INTRAVENOUS | Status: AC
  Filled 2024-02-26: qty 100

## 2024-02-26 NOTE — Op Note (Signed)
 Preoperative diagnosis: Incisional hernias.  Postoperative diagnosis: Incisional hernias.  Procedure: Incisional hernia repair (midline and left lower quadrant) with mesh                     Bilateral Myocutaneous flaps                     Anesthesia: GETA  Surgeon: Dr. Cesar Coe  Assistant: Gilmer Fret, PA  Wound Classification: Clean  Indications:Patient is a 69 y.o. female developed a ventral hernia in the site of a previous incision (midline and previous colostomy site) . This was symptomatic and incarcerated and repair was indicated.   Findings: 1. A 10 cm x 5 cm incisional midline hernia and a 5 cm x 5 cm left lower quadrant  incisional hernia for a total of 20 cm  x 15 cm combined defect  2. A 30 cm x 30 cm Bard soft mesh mesh used for repair 3. No hollow viscus organ injury identified during procedure 4. Tension free repair achieved 5. Adequate hemostasis  Description of procedure: The patient was brought to the operating room and general anesthesia was induced. A time-out was completed verifying correct patient, procedure, site, positioning, and implant(s) and/or special equipment prior to beginning this procedure. Antibiotics were administered prior to making the incision. The anterior abdominal wall was prepped and draped in the standard sterile fashion. A vertical midline incision incorporating the old incision was made. The old scar was completely excised. The incision was deepened to the fascia. The hernia sac was then identified and dissected free. The peritoneum of the sac was entered and the contents were reduced. The fascia was carefully palpated and additional defects were identified. Intervening fascial bridges were cut to create a single defect.  Adhesions to the underside of the abdominal wall were  identified and a very difficult and time consuming lysis of adhesions was needed to be done to create the field for the hernia repair and mesh insertion.   Bilateral myocutaneous flaps were created to release tension to be able to approximate the fascia to the midline. The midline fascia was dissected separating the anterior and posterior fascia creating a posterior component separation to further approximate the fascia to the midline. This was done from xyphoid process to pubis bilaterally. The peritoneum and posterior fascia was approximated at midline and closed with a running 2-0 PDS suture. A piece of 30 cm x 30 cm mesh was placed between the posterior fascia and the rectus muscle and sutured circumferentially to the anterior fascia with multiple 2-0 PDS interrumpted sutures. The anterior fascia was then closed with a running 0- PDS suture on midline. A closed suction drain was placed on the myocutaneous flaps and retrieved through a different incision. Subcutaneous tissues were closed with 2-0 Vicryl and the skin was closed with subcuticular sutures of skin staples. A negative pressure dressing (DME) was applied to the wound covering an area of 20 x 2 cm (40 sq cm).  The patient tolerated the procedure well and was brought to the postanesthesia care unit in stable condition.   Specimen: Hernia sac  Complications: None  Estimated Blood Loss: 50 mL

## 2024-02-26 NOTE — Plan of Care (Signed)

## 2024-02-26 NOTE — Interval H&P Note (Signed)
 History and Physical Interval Note:  02/26/2024 8:16 AM  Sarah Stephenson  has presented today for surgery, with the diagnosis of K43.2 incisional hernia w/o obstruction or gangrene.  The various methods of treatment have been discussed with the patient and family. After consideration of risks, benefits and other options for treatment, the patient has consented to  Procedures: REPAIR, HERNIA, VENTRAL (N/A) as a surgical intervention.  The patient's history has been reviewed, patient examined, no change in status, stable for surgery.  I have reviewed the patient's chart and labs.  Questions were answered to the patient's satisfaction.     Lucas Sjogren

## 2024-02-26 NOTE — Transfer of Care (Signed)
 Immediate Anesthesia Transfer of Care Note  Patient: Sarah Stephenson  Procedure(s) Performed: REPAIR, HERNIA, VENTRAL (Abdomen) INSERTION OF MESH (Abdomen)  Patient Location: PACU  Anesthesia Type:General  Level of Consciousness: sedated and drowsy  Airway & Oxygen Therapy: Patient Spontanous Breathing and Patient connected to face mask oxygen  Post-op Assessment: Report given to RN and Post -op Vital signs reviewed and stable  Post vital signs: Reviewed and stable  Last Vitals:  Vitals Value Taken Time  BP 125/53 02/26/24 14:10  Temp    Pulse 79 02/26/24 14:10  Resp 12 02/26/24 14:10  SpO2 94 % 02/26/24 14:10    Last Pain:  Vitals:   02/26/24 0706  TempSrc: Temporal  PainSc: 0-No pain         Complications: No notable events documented.

## 2024-02-26 NOTE — Progress Notes (Signed)
 PHARMACIST - PHYSICIAN COMMUNICATION  CONCERNING:  Enoxaparin  (Lovenox ) for DVT Prophylaxis    RECOMMENDATION: Patient was prescribed enoxaprin 40mg  q24 hours for VTE prophylaxis.   Filed Weights   02/26/24 0706  Weight: 93.9 kg (207 lb)    Body mass index is 32.42 kg/m.  CrCl cannot be calculated (Patient's most recent lab result is older than the maximum 21 days allowed.).   Based on [facility] policy patient is candidate for enoxaparin  0.5mg /kg TBW SQ every 24 hours based on BMI being >30.  DESCRIPTION: Pharmacy has adjusted enoxaparin  dose per [facility] policy.  Patient is now receiving enoxaparin  47.5 mg every 24 hours    Damien Napoleon, PharmD Clinical Pharmacist  02/26/2024 4:51 PM

## 2024-02-26 NOTE — Anesthesia Procedure Notes (Signed)
 Procedure Name: Intubation Date/Time: 02/26/2024 10:09 AM  Performed by: Dominica Krabbe, CRNAPre-anesthesia Checklist: Patient identified, Emergency Drugs available, Suction available, Patient being monitored and Timeout performed Patient Re-evaluated:Patient Re-evaluated prior to induction Oxygen Delivery Method: Circle system utilized Preoxygenation: Pre-oxygenation with 100% oxygen Induction Type: IV induction Ventilation: Mask ventilation without difficulty Laryngoscope Size: McGrath and 3 Grade View: Grade I Tube type: Oral Tube size: 7.0 mm Number of attempts: 1 Airway Equipment and Method: Stylet and Video-laryngoscopy Placement Confirmation: ETT inserted through vocal cords under direct vision, positive ETCO2 and breath sounds checked- equal and bilateral Secured at: 21 cm Tube secured with: Tape Dental Injury: Teeth and Oropharynx as per pre-operative assessment  Difficulty Due To: Difficult Airway- due to reduced neck mobility

## 2024-02-26 NOTE — Anesthesia Preprocedure Evaluation (Signed)
"                                    Anesthesia Evaluation  Patient identified by MRN, date of birth, ID band Patient awake    Reviewed: Allergy & Precautions, H&P , NPO status , Patient's Chart, lab work & pertinent test results, reviewed documented beta blocker date and time   Airway Mallampati: III  TM Distance: >3 FB Neck ROM: full    Dental  (+) Teeth Intact   Pulmonary shortness of breath and with exertion, sleep apnea and Continuous Positive Airway Pressure Ventilation , pneumonia, resolved, Current Smoker and Patient abstained from smoking.   Pulmonary exam normal        Cardiovascular Exercise Tolerance: Poor hypertension, On Medications negative cardio ROS Normal cardiovascular exam Rhythm:regular Rate:Normal     Neuro/Psych  Headaches PSYCHIATRIC DISORDERS  Depression       GI/Hepatic Neg liver ROS,GERD  Medicated,,  Endo/Other  negative endocrine ROS    Renal/GU negative Renal ROS  negative genitourinary   Musculoskeletal   Abdominal   Peds  Hematology  (+) Blood dyscrasia, anemia   Anesthesia Other Findings Past Medical History: No date: Allergy No date: Anemia No date: Depression No date: Diverticulitis No date: Dyspnea No date: GERD (gastroesophageal reflux disease) No date: Headache No date: Hypertension No date: Pneumonia No date: Psoriasis No date: Psoriatic arthritis (HCC) No date: Sleep apnea No date: Thyroid  disease Past Surgical History: No date: BUNIONECTOMY No date: COLON SURGERY     Comment:  partial colostomy creation due to diverticulitis in 2018 No date: COLOSTOMY TAKEDOWN No date: EYE SURGERY No date: TONSILLECTOMY No date: WISDOM TOOTH EXTRACTION BMI    Body Mass Index: 32.42 kg/m     Reproductive/Obstetrics negative OB ROS                              Anesthesia Physical Anesthesia Plan  ASA: 3  Anesthesia Plan: General ETT   Post-op Pain Management:    Induction:    PONV Risk Score and Plan: 3  Airway Management Planned:   Additional Equipment:   Intra-op Plan:   Post-operative Plan:   Informed Consent: I have reviewed the patients History and Physical, chart, labs and discussed the procedure including the risks, benefits and alternatives for the proposed anesthesia with the patient or authorized representative who has indicated his/her understanding and acceptance.     Dental Advisory Given  Plan Discussed with: CRNA  Anesthesia Plan Comments:         Anesthesia Quick Evaluation  "

## 2024-02-27 ENCOUNTER — Encounter: Payer: Self-pay | Admitting: General Surgery

## 2024-02-27 LAB — BASIC METABOLIC PANEL WITH GFR
Anion gap: 8 (ref 5–15)
BUN: 18 mg/dL (ref 8–23)
CO2: 26 mmol/L (ref 22–32)
Calcium: 9.3 mg/dL (ref 8.9–10.3)
Chloride: 103 mmol/L (ref 98–111)
Creatinine, Ser: 0.68 mg/dL (ref 0.44–1.00)
GFR, Estimated: >60 mL/min
Glucose, Bld: 113 mg/dL — ABNORMAL HIGH (ref 70–99)
Potassium: 4.8 mmol/L (ref 3.5–5.1)
Sodium: 137 mmol/L (ref 135–145)

## 2024-02-27 LAB — SURGICAL PATHOLOGY

## 2024-02-27 LAB — CBC
HCT: 37.9 % (ref 36.0–46.0)
Hemoglobin: 12.4 g/dL (ref 12.0–15.0)
MCH: 31.4 pg (ref 26.0–34.0)
MCHC: 32.7 g/dL (ref 30.0–36.0)
MCV: 95.9 fL (ref 80.0–100.0)
Platelets: 195 K/uL (ref 150–400)
RBC: 3.95 MIL/uL (ref 3.87–5.11)
RDW: 12.6 % (ref 11.5–15.5)
WBC: 8.7 K/uL (ref 4.0–10.5)
nRBC: 0 % (ref 0.0–0.2)

## 2024-02-27 NOTE — Evaluation (Signed)
 Physical Therapy Evaluation Patient Details Name: Sarah Stephenson MRN: [id number] DOB: 1956-01-16 Today's Date: 02/27/2024  History of Present Illness  Patient is a 69 y.o. female developed a ventral hernia in the site of a previous incision (midline and previous colostomy site) .  Clinical Impression  Patient noted to be in supine position at PT arrival in room, for an initial PT evaluation due to a decline in functional status, with baseline mobility reported as independent, and currently requiring CGA for transfers and room ambulation. The patient is A&O x 4, presenting with fair willingness to work with PT. The patient resides in a house and lives with husband with family/friend support. There are no STE inside the residence.  Vitals are stable. Gait was assessed with no AD. Gait mechanic observations noted mild unsteadiness. The overall clinical impression is that the patient presents with mild mobility limitations. Recommended skilled PT will address safety, mobility, and discharge planning.        If plan is discharge home, recommend the following: A little help with walking and/or transfers;Assist for transportation   Can travel by private vehicle        Equipment Recommendations None recommended by PT  Recommendations for Other Services       Functional Status Assessment Patient has had a recent decline in their functional status and demonstrates the ability to make significant improvements in function in a reasonable and predictable amount of time.     Precautions / Restrictions Precautions Precaution/Restrictions Comments: lifting restrictions Restrictions Weight Bearing Restrictions Per Provider Order: No      Mobility  Bed Mobility Overal bed mobility: Modified Independent                  Transfers Overall transfer level: Needs assistance Equipment used: 1 person hand held assist Transfers: Sit to/from Stand Sit to Stand: Contact guard assist                 Ambulation/Gait Ambulation/Gait assistance: Supervision, Modified independent (Device/Increase time) Gait Distance (Feet): 50 Feet Assistive device: 1 person hand held assist Gait Pattern/deviations: Step-through pattern, Antalgic, Trunk flexed Gait velocity: decreased     General Gait Details: mild unsteadiness; often reaching towards wall desk/sink top for stability states that this is not normal  Stairs            Wheelchair Mobility     Tilt Bed    Modified Rankin (Stroke Patients Only)       Balance Overall balance assessment: Needs assistance Sitting-balance support: Feet supported Sitting balance-Leahy Scale: Good     Standing balance support: During functional activity Standing balance-Leahy Scale: Good                               Pertinent Vitals/Pain Pain Assessment Pain Assessment: Faces Faces Pain Scale: Hurts even more Pain Location: abdomen Pain Intervention(s): Repositioned, Limited activity within patient's tolerance, Monitored during session    Home Living Family/patient expects to be discharged to:: Private residence Living Arrangements: Spouse/significant other Available Help at Discharge: Family;Available 24 hours/day Type of Home: House Home Access: Level entry       Home Layout: Two level;Able to live on main level with bedroom/bathroom Home Equipment: None;Shower seat - built in      Prior Function Prior Level of Function : Independent/Modified Independent                     Extremity/Trunk  Assessment   Upper Extremity Assessment Upper Extremity Assessment: Overall WFL for tasks assessed    Lower Extremity Assessment Lower Extremity Assessment: Generalized weakness    Cervical / Trunk Assessment Cervical / Trunk Assessment: Normal  Communication   Communication Communication: No apparent difficulties    Cognition Arousal: Alert Behavior During Therapy: WFL for tasks  assessed/performed   PT - Cognitive impairments: No apparent impairments                         Following commands: Intact       Cueing Cueing Techniques: Verbal cues     General Comments      Exercises     Assessment/Plan    PT Assessment Patient needs continued PT services  PT Problem List Decreased strength;Decreased activity tolerance;Decreased balance;Decreased mobility       PT Treatment Interventions Gait training;Stair training;Functional mobility training;Therapeutic activities;Therapeutic exercise;Balance training;Neuromuscular re-education;Patient/family education    PT Goals (Current goals can be found in the Care Plan section)  Acute Rehab PT Goals Patient Stated Goal: Pt wants to go home PT Goal Formulation: With patient Time For Goal Achievement: 03/19/24 Potential to Achieve Goals: Good    Frequency Min 2X/week     Co-evaluation               AM-PAC PT 6 Clicks Mobility  Outcome Measure Help needed turning from your back to your side while in a flat bed without using bedrails?: None Help needed moving from lying on your back to sitting on the side of a flat bed without using bedrails?: None Help needed moving to and from a bed to a chair (including a wheelchair)?: None Help needed standing up from a chair using your arms (e.g., wheelchair or bedside chair)?: None Help needed to walk in hospital room?: A Little Help needed climbing 3-5 steps with a railing? : A Little 6 Click Score: 22    End of Session   Activity Tolerance: Patient tolerated treatment well Patient left: in bed;with call bell/phone within reach;with bed alarm set Nurse Communication: Mobility status PT Visit Diagnosis: Unsteadiness on feet (R26.81);Muscle weakness (generalized) (M62.81)    Time: 8555-8540 PT Time Calculation (min) (ACUTE ONLY): 15 min   Charges:   PT Evaluation $PT Eval Low Complexity: 1 Low   PT General Charges $$ ACUTE PT VISIT: 1  Visit         Sherlean Lesches DPT, PT    Nirvi Boehler A Alexiana Laverdure 02/27/2024, 3:08 PM

## 2024-02-27 NOTE — TOC CM/SW Note (Signed)
 Transition of Care [facility]) - Inpatient Brief Assessment   Patient Details  Name: Sarah Stephenson MRN: [id number] Date of Birth: 1955-09-13  Transition of Care [facility] CM/SW Contact:    Corean ONEIDA Haddock, RN Phone Number: 02/27/2024, 11:52 AM   Clinical Narrative:  Transition of Care Department [facility]) has reviewed patient and no TOC needs have been identified at this time.  If new patient transition needs arise, please place a TOC consult.    Transition of Care Asessment: Insurance and Status: Insurance coverage has been reviewed Patient has primary care physician: Yes     Prior/Current Home Services: No current home services Social Drivers of Health Review: SDOH reviewed no interventions necessary Readmission risk has been reviewed: Yes Transition of care needs: no transition of care needs at this time

## 2024-02-27 NOTE — Progress Notes (Signed)
 Patient ID: Lanai Conlee, female   DOB: 28-Oct-1955, 69 y.o.   MRN: [id number]     SURGICAL PROGRESS NOTE   Hospital Day(s): 1.   Interval History: Patient seen and examined, no acute events or new complaints overnight. Patient reports she has been doing okay.  She endorses the pain is currently controlled with current pain medication.  Expected surgical pain throughout the abdomen.  No pain radiation.  No specific  aggravating factors.  Alleviating factor is his current pain medications.  Patient denies passing any gas or having bowel movement.  Patient denies any nausea.  Patient endorses she was able to get out of bed.  There has been no distress.  Drain starting to exchange to serosanguineous.  Vital signs in last 24 hours: [min-max] current  Temp:  [97 F (36.1 C)-98.3 F (36.8 C)] 98.3 F (36.8 C) (01/08 0416) Pulse Rate:  [69-81] 71 (01/08 0416) Resp:  [10-20] 16 (01/08 0416) BP: (100-125)/(52-61) 112/56 (01/08 0416) SpO2:  [90 %-96 %] 90 % (01/08 0416)     Height: 5' 7 (170.2 cm) Weight: 93.9 kg BMI (Calculated): 32.41   Physical Exam:  Constitutional: alert, cooperative and no distress  Respiratory: breathing non-labored at rest  Cardiovascular: regular rate and sinus rhythm  Gastrointestinal: soft, non-tender, and non-distended.  Prevena in place  Labs:     Latest Ref Rng & Units 02/27/2024    5:13 AM 07/04/2020    2:59 PM 02/01/2020   10:12 AM  CBC  WBC 4.0 - 10.5 K/uL 8.7  6.4  7.3   Hemoglobin 12.0 - 15.0 g/dL 87.5  84.7  84.7   Hematocrit 36.0 - 46.0 % 37.9  43.6  43.4   Platelets 150 - 400 K/uL 195  214  211       Latest Ref Rng & Units 02/27/2024    5:13 AM 07/04/2020    2:59 PM 02/01/2020   10:12 AM  CMP  Glucose 70 - 99 mg/dL 886  94  897   BUN 8 - 23 mg/dL 18  14  17    Creatinine 0.44 - 1.00 mg/dL 9.31  9.23  9.16   Sodium 135 - 145 mmol/L 137  140  140   Potassium 3.5 - 5.1 mmol/L 4.8  4.7  4.6   Chloride 98 - 111 mmol/L 103  101  102   CO2 22 - 32 mmol/L  26  23  23    Calcium 8.9 - 10.3 mg/dL 9.3  89.9  9.9   Total Protein 6.0 - 8.5 g/dL  7.3  7.4   Total Bilirubin 0.0 - 1.2 mg/dL  <9.7  0.3   Alkaline Phos 44 - 121 IU/L  82  87   AST 0 - 40 IU/L  20  19   ALT 0 - 32 IU/L  17  13     Imaging studies: No new pertinent imaging studies   Assessment/Plan:  69 y.o. female with incisional hernia 1 Day Post-Op s/p incisional hernia repair with bilateral TAR, complicated by pertinent comorbidities including psoriasis.  -Stable vital signs, no fever, tachycardia - Abdominal exam benign with expected surgical pain - Drain treatment to serosanguineous and adequate output - There has been no nausea or vomiting.  Patient still not passing gas but no abdominal distention - Will advance diet to soft diet - Encourage patient to ambulate - Continue pain management  Lucas Petrin, MD

## 2024-02-27 NOTE — Progress Notes (Signed)
 Mobility Specialist - Progress Note  Post-mobility: HR 65, BP 130/58   02/27/24 1220  Mobility  Activity Ambulated with assistance  Level of Assistance Standby assist, set-up cues, supervision of patient - no hands on  Assistive Device Front wheel walker  Distance Ambulated (ft) 80 ft  Activity Response Tolerated well  Mobility visit 1 Mobility  Mobility Specialist Start Time (ACUTE ONLY) 1138  Mobility Specialist Stop Time (ACUTE ONLY) 1159  Mobility Specialist Time Calculation (min) (ACUTE ONLY) 21 min   Pt supine upon entry, utilizing RA. She completed bed mob ModI, requiring CGA to stand and MinG-SBA during amb. Pt amb a self-selected 80 ft in the hallway, tolerated well-- she endorsed dizziness and pain in abd, expressed feeling weird when standing. Pt returned to the room, left seated in the recliner with needs within reach.   Sarah Stephenson Mobility Specialist 02/27/2024 12:25 PM

## 2024-02-27 NOTE — Anesthesia Postprocedure Evaluation (Signed)
"   Anesthesia Post Note  Patient: Sarah Stephenson  Procedure(s) Performed: REPAIR, HERNIA, VENTRAL (Abdomen) INSERTION OF MESH (Abdomen)  Patient location during evaluation: PACU Anesthesia Type: General Level of consciousness: awake and alert Pain management: pain level controlled Vital Signs Assessment: post-procedure vital signs reviewed and stable Respiratory status: spontaneous breathing, nonlabored ventilation, respiratory function stable and patient connected to nasal cannula oxygen Cardiovascular status: blood pressure returned to baseline and stable Postop Assessment: no apparent nausea or vomiting Anesthetic complications: no   No notable events documented.   Last Vitals:  Vitals:   02/26/24 2000 02/27/24 0416  BP: 120/60 (!) 112/56  Pulse: 72 71  Resp: 18 16  Temp: 36.4 C 36.8 C  SpO2: 95% 90%    Last Pain:  Vitals:   02/27/24 0051  TempSrc:   PainSc: Asleep                 Lynwood KANDICE Clause      "

## 2024-02-28 LAB — CBC
HCT: 37 % (ref 36.0–46.0)
Hemoglobin: 12 g/dL (ref 12.0–15.0)
MCH: 31.3 pg (ref 26.0–34.0)
MCHC: 32.4 g/dL (ref 30.0–36.0)
MCV: 96.4 fL (ref 80.0–100.0)
Platelets: 180 K/uL (ref 150–400)
RBC: 3.84 MIL/uL — ABNORMAL LOW (ref 3.87–5.11)
RDW: 13 % (ref 11.5–15.5)
WBC: 9.1 K/uL (ref 4.0–10.5)
nRBC: 0 % (ref 0.0–0.2)

## 2024-02-28 LAB — BASIC METABOLIC PANEL WITH GFR
Anion gap: 9 (ref 5–15)
BUN: 21 mg/dL (ref 8–23)
CO2: 26 mmol/L (ref 22–32)
Calcium: 8.9 mg/dL (ref 8.9–10.3)
Chloride: 104 mmol/L (ref 98–111)
Creatinine, Ser: 0.81 mg/dL (ref 0.44–1.00)
GFR, Estimated: >60 mL/min
Glucose, Bld: 101 mg/dL — ABNORMAL HIGH (ref 70–99)
Potassium: 4.5 mmol/L (ref 3.5–5.1)
Sodium: 139 mmol/L (ref 135–145)

## 2024-02-28 LAB — GENECONNECT MOLECULAR SCREEN: Genetic Analysis Overall Interpretation: NEGATIVE

## 2024-02-28 NOTE — Progress Notes (Signed)
 [facility]- General Surgery  SURGICAL PROGRESS NOTE  Hospital Day(s): 2.   Post op day(s): 2 Days Post-Op.   Interval History:  Patient is status post day 2 of incisional hernia repair with bilateral TAR. Patient seen and examined. No acute events or new complaints overnight.  Has been tolerating regular food.  Denies any nausea or vomiting.  Reports passing some gas but no bowel movement.  Pain is overall controlled with pain medication. Recorded JP output in the last 24 hours: 230 cc  Vital signs in last 24 hours: [min-max] current  Temp:  [97.7 F (36.5 C)-98 F (36.7 C)] 98 F (36.7 C) (01/09 0824) Pulse Rate:  [66-70] 66 (01/09 0824) Resp:  [17-20] 20 (01/09 0824) BP: (109-126)/(49-61) 110/49 (01/09 0824) SpO2:  [94 %-96 %] 95 % (01/09 0824)     Height: 5' 7 (170.2 cm) Weight: 93.9 kg BMI (Calculated): 32.41   Intake/Output last 2 shifts:  01/08 0701 - 01/09 0700 In: 840 [P.O.:840] Out: 230 [Drains:230]   Physical Exam:  Constitutional: alert, cooperative and no distress  Respiratory: breathing non-labored at rest  Cardiovascular: regular rate and sinus rhythm  Gastrointestinal: soft, mild tenderness, and non-distended, Prevena VAC was not suctioning, placed to charge and started suctioning again.  Output from drain is more clear  Labs:     Latest Ref Rng & Units 02/28/2024    5:07 AM 02/27/2024    5:13 AM 07/04/2020    2:59 PM  CBC  WBC 4.0 - 10.5 K/uL 9.1  8.7  6.4   Hemoglobin 12.0 - 15.0 g/dL 87.9  87.5  84.7   Hematocrit 36.0 - 46.0 % 37.0  37.9  43.6   Platelets 150 - 400 K/uL 180  195  214       Latest Ref Rng & Units 02/28/2024    5:07 AM 02/27/2024    5:13 AM 07/04/2020    2:59 PM  CMP  Glucose 70 - 99 mg/dL 898  886  94   BUN 8 - 23 mg/dL 21  18  14    Creatinine 0.44 - 1.00 mg/dL 9.18  9.31  9.23   Sodium 135 - 145 mmol/L 139  137  140   Potassium 3.5 - 5.1 mmol/L 4.5  4.8  4.7   Chloride 98 - 111 mmol/L 104  103  101   CO2 22 - 32 mmol/L 26  26  23     Calcium 8.9 - 10.3 mg/dL 8.9  9.3  89.9   Total Protein 6.0 - 8.5 g/dL   7.3   Total Bilirubin 0.0 - 1.2 mg/dL   <9.7   Alkaline Phos 44 - 121 IU/L   82   AST 0 - 40 IU/L   20   ALT 0 - 32 IU/L   17     Imaging studies: No new pertinent imaging studies   Assessment/Plan:  69 y.o. female with incisional hernia 2 Days Post-Op s/p incisional hernia repair with bilateral TAR, complicated by pertinent comorbidities including psoriasis.    - Stable vital signs, no fever not tachycardic. No leukocytosis. Appears stable on exam and abdominal discomfort overall well-controlled with pain medication. Serosanguineous output in drain is more clear.  Has been tolerating soft diet and is still passing gas. Patient has been recovering well. Depending on how she feels throughout the day, possible discharge home this afternoon.   - In the meantime, continue pain management and encouraged to ambulate.  -- Gilmer Cea PA-C

## 2024-02-28 NOTE — Evaluation (Signed)
 Occupational Therapy Evaluation Patient Details Name: Sarah Stephenson MRN: [id number] DOB: January 22, 1956 Today's Date: 02/28/2024   History of Present Illness   Pt is a 69 year old female with incisional hernia  s/p incisional hernia repair with bilateral TAR on 02/26/24     Clinical Impressions Chart reviewed to date, pt greeted in chair, alert and oriented x4. Supportive husband Garrel present throughout. PTA pt is MOD I-I in ADL/IADL, amb with no AD. She presents with deficits in endurance, activity tolerance, balance, affecting safe and optimal ADL completion. Education provided re: compensatory techniques/ AE for decreased forward trunk flexion for LB ADLs. Abdominal binder is donned throughout. Please see further details below. Pt will benefit from acute OT to address functional deficits and to facilitate optimal ADL/functional mobility performance. OT will follow.      If plan is discharge home, recommend the following:   A little help with bathing/dressing/bathroom;Assistance with cooking/housework     Functional Status Assessment   Patient has had a recent decline in their functional status and demonstrates the ability to make significant improvements in function in a reasonable and predictable amount of time.     Equipment Recommendations   None recommended by OT     Recommendations for Other Services         Precautions/Restrictions   Precautions Precautions: Fall Recall of Precautions/Restrictions: Intact Required Braces or Orthoses: Other Brace Other Brace: abdominal binder Restrictions Weight Bearing Restrictions Per Provider Order: No     Mobility Bed Mobility Overal bed mobility: Needs Assistance Bed Mobility: Rolling, Sit to Sidelying Rolling: Supervision       Sit to sidelying: Supervision General bed mobility comments: edu re: log roll with good carry over    Transfers Overall transfer level: Needs assistance Equipment used: None Transfers:  Sit to/from Stand Sit to Stand: Supervision                  Balance Overall balance assessment: Needs assistance Sitting-balance support: Feet supported Sitting balance-Leahy Scale: Good     Standing balance support: No upper extremity supported, During functional activity Standing balance-Leahy Scale: Fair Standing balance comment: does reach for external support when amb approx 50% of the time                           ADL either performed or assessed with clinical judgement   ADL Overall ADL's : Needs assistance/impaired     Grooming: Supervision/safety;Standing;Wash/dry face;Wash/dry hands;Oral care Grooming Details (indicate cue type and reason): no AD at sink level             Lower Body Dressing: Maximal assistance Lower Body Dressing Details (indicate cue type and reason): socks, simulated; edu pt/husband re compensatory techniques/AE for decreased bending forward Toilet Transfer: Supervision/safety;Ambulation;Regular Toilet   Toileting- Clothing Manipulation and Hygiene: Supervision/safety;Sitting/lateral lean       Functional mobility during ADLs: Supervision/safety (approx 200')       Vision Patient Visual Report: No change from baseline       Perception         Praxis         Pertinent Vitals/Pain Pain Assessment Pain Assessment: 0-10 Pain Score: 8  Pain Location: abdomen Pain Descriptors / Indicators: Discomfort, Grimacing Pain Intervention(s): Limited activity within patient's tolerance, Monitored during session, Premedicated before session, Repositioned     Extremity/Trunk Assessment Upper Extremity Assessment Upper Extremity Assessment: Overall WFL for tasks assessed   Lower Extremity Assessment Lower Extremity  Assessment: Defer to PT evaluation       Communication Communication Communication: No apparent difficulties   Cognition Arousal: Alert Behavior During Therapy: WFL for tasks  assessed/performed Cognition: No apparent impairments                               Following commands: Intact       Cueing  General Comments   Cueing Techniques: Verbal cues  vss throughout, small amount of drainage noted from under bandage for JP, nurse notified;   Exercises Other Exercises Other Exercises: edu pt/husband re role of OT, role of rehab, discharge recommendations, safer ADL completion with AE/compensatory techniques   Shoulder Instructions      Home Living Family/patient expects to be discharged to:: Private residence Living Arrangements: Spouse/significant other Available Help at Discharge: Family;Available 24 hours/day Type of Home: House Home Access: Level entry     Home Layout: Two level;Able to live on main level with bedroom/bathroom     Bathroom Shower/Tub: Tub/shower unit;Walk-in shower   Bathroom Toilet: Standard Bathroom Accessibility: Yes   Home Equipment: Shower seat - built in          Prior Functioning/Environment Prior Level of Function : Independent/Modified Independent;Working/employed;Driving               ADLs Comments: works at [employer]    OT Problem List: Decreased activity tolerance;Impaired balance (sitting and/or standing);Decreased knowledge of use of DME or AE   OT Treatment/Interventions: Self-care/ADL training;Therapeutic exercise;Energy conservation;DME and/or AE instruction;Patient/family education;Balance training;Therapeutic activities      OT Goals(Current goals can be found in the care plan section)   Acute Rehab OT Goals Patient Stated Goal: go home OT Goal Formulation: With patient Time For Goal Achievement: 03/13/24 Potential to Achieve Goals: Good ADL Goals Pt Will Perform Grooming: with modified independence Pt Will Perform Lower Body Dressing: with modified independence;sitting/lateral leans;sit to/from stand Pt Will Transfer to Toilet: with modified independence;ambulating Pt  Will Perform Toileting - Clothing Manipulation and hygiene: sitting/lateral leans;with modified independence;sit to/from stand   OT Frequency:  Min 2X/week    Co-evaluation              AM-PAC OT 6 Clicks Daily Activity     Outcome Measure Help from another person eating meals?: None Help from another person taking care of personal grooming?: None Help from another person toileting, which includes using toliet, bedpan, or urinal?: None Help from another person bathing (including washing, rinsing, drying)?: A Little Help from another person to put on and taking off regular upper body clothing?: None Help from another person to put on and taking off regular lower body clothing?: A Lot 6 Click Score: 21   End of Session Nurse Communication: Mobility status (draingage from JP)  Activity Tolerance: Patient tolerated treatment well Patient left: in bed;with call bell/phone within reach;with family/visitor present  OT Visit Diagnosis: Other abnormalities of gait and mobility (R26.89)                Time: 8946-8885 OT Time Calculation (min): 21 min Charges:  OT General Charges $OT Visit: 1 Visit OT Evaluation $OT Eval Low Complexity: 1 Low  Therisa Sheffield, OTD OTR/L  02/28/2024, 1:02 PM

## 2024-02-28 NOTE — Plan of Care (Signed)

## 2024-02-29 ENCOUNTER — Other Ambulatory Visit: Payer: Self-pay

## 2024-02-29 MED ORDER — METHOCARBAMOL 500 MG PO TABS
500.0000 mg | ORAL_TABLET | Freq: Three times a day (TID) | ORAL | 0 refills | Status: DC | PRN
  Filled 2024-02-29: qty 21, 7d supply, fill #0

## 2024-02-29 MED ORDER — GABAPENTIN 300 MG PO CAPS
300.0000 mg | ORAL_CAPSULE | Freq: Two times a day (BID) | ORAL | 0 refills | Status: AC
  Filled 2024-02-29: qty 14, 7d supply, fill #0

## 2024-02-29 MED ORDER — HYDROCODONE-ACETAMINOPHEN 5-325 MG PO TABS
1.0000 | ORAL_TABLET | Freq: Four times a day (QID) | ORAL | 0 refills | Status: DC | PRN
  Filled 2024-02-29: qty 30, 8d supply, fill #0

## 2024-02-29 NOTE — Discharge Instructions (Addendum)
" °  Diet: Resume home heart healthy regular diet.   Activity: No heavy lifting >20 pounds (children, pets, laundry, garbage) or strenuous activity until follow-up, but light activity and walking are encouraged. Do not drive or drink alcohol if taking narcotic pain medications.  Wound care: May shower with soapy water  and pat dry (do not rub incisions), but no baths or submerging incision underwater until follow-up. (no swimming)   Empty and chart JP output twice per day.   Medications: Resume all home medications. For mild to moderate pain: acetaminophen  (Tylenol ) or ibuprofen (if no kidney disease). Combining Tylenol  with alcohol can substantially increase your risk of causing liver disease. Narcotic pain medications, if prescribed, can be used for severe pain, though may cause nausea, constipation, and drowsiness. Do not combine Tylenol  and Norco within a 6 hour period as Norco contains Tylenol . If you do not need the narcotic pain medication, you do not need to fill the prescription.  Call office [phone]) at any time if any questions, worsening pain, fevers/chills, bleeding, drainage from incision site, or other concerns.  "

## 2024-02-29 NOTE — Plan of Care (Signed)

## 2024-02-29 NOTE — Discharge Summary (Signed)
 " Patient ID: Sarah Stephenson MRN: [id number] DOB/AGE: October 07, 1955 69 y.o.  Admit date: 02/26/2024 Discharge date: 03/01/2024   Discharge Diagnoses:  Principal Problem:   Incisional hernia   Procedures:Incisional hernia repair with mesh  Hospital Course: Patient admitted for surgical management of large multiple incisional hernias. She underwent open repair with bilateral TAR with mesh placement. She has been recovering slowly but adequately. Pain under control. She is tolerating regular diet. She is having adequate drain output, serosanguinous. Ambulating with minimal assistance. Wound is healing without sign of infection.   Physical Exam Vitals reviewed.  HENT:     Head: Normocephalic.     Nose: Nose normal.  Cardiovascular:     Rate and Rhythm: Normal rate and regular rhythm.  Pulmonary:     Effort: Pulmonary effort is normal.  Abdominal:     General: Abdomen is flat. Bowel sounds are normal. There is no distension.     Hernia: No hernia is present.     Comments: Midline wound is dry and clean. No erythema. JP drain on right lower quadrant with serosanguinous output.   Musculoskeletal:        General: Normal range of motion.     Cervical back: Normal range of motion.  Skin:    General: Skin is warm.     Capillary Refill: Capillary refill takes less than 2 seconds.  Neurological:     General: No focal deficit present.     Mental Status: She is alert and oriented to person, place, and time.    Consults: PT and OT  Disposition: Discharge disposition: 01-Home or Self Care       Discharge Instructions     Diet - low sodium heart healthy   Complete by: As directed    Increase activity slowly   Complete by: As directed       Allergies as of 03/01/2024       Reactions   Oxycodone  Other (See Comments)   Gets very hyper with this.   Sulfa Antibiotics Hives   Hives   Venlafaxine Nausea Only        Medication List     TAKE these medications    albuterol   108 (90 Base) MCG/ACT inhaler Commonly known as: VENTOLIN  HFA Inhale 2 puffs into the lungs every 2 (two) hours as needed for wheezing or shortness of breath (cough).   amLODipine  5 MG tablet Commonly known as: NORVASC  Take 5 mg by mouth daily.   benzonatate  100 MG capsule Commonly known as: Tessalon  Perles Take 1 capsule (100 mg total) by mouth 3 (three) times daily as needed for cough (cough).   buPROPion  150 MG 24 hr tablet Commonly known as: WELLBUTRIN  XL Take 150 mg by mouth daily.   CO Q-10 PO Take 1 capsule by mouth daily.   esomeprazole 20 MG capsule Commonly known as: NEXIUM Take 20 mg by mouth daily as needed (acid reflux).   ezetimibe 10 MG tablet Commonly known as: ZETIA Take 10 mg by mouth daily.   gabapentin  300 MG capsule Commonly known as: NEURONTIN  Take 1 capsule (300 mg total) by mouth 2 (two) times daily.   HYDROcodone -acetaminophen  5-325 MG tablet Commonly known as: NORCO/VICODIN Take 1 tablet by mouth every 6 (six) hours as needed for up to 8 days.   levothyroxine  137 MCG tablet Commonly known as: SYNTHROID  Take 137 mcg by mouth daily before breakfast.   methocarbamol  500 MG tablet Commonly known as: ROBAXIN  Take 1 tablet (500 mg total) by mouth every  8 (eight) hours as needed for up to 7 days for muscle spasms.   mometasone  0.1 % cream Commonly known as: ELOCON  Apply 1 Application topically daily as needed (Rash).   Taltz  80 MG/ML pen Generic drug: ixekizumab  INJECT 1 PEN UNDER THE SKIN EVERY 4 WEEKS   Tremfya  One-Press 100 MG/ML Sopn Generic drug: Guselkumab  Inject 100 mg into the skin every 8 (eight) weeks. Maintenance dose, 100mg  sq injections q 8 weeks   Tremfya  One-Press 100 MG/ML Sopn Generic drug: Guselkumab  Inject 1 mL into the skin as directed. Inject 1 pen on week 0 and then again on week 4 for loading dose        Follow-up Information     Rodolph Romano, MD Follow up on 03/03/2024.   Specialty: General  Surgery Contact information: [address] [place] [address] [phone]                 This discharge encounter was for 35-minute most of the time counseling the patient and coordinating plan of care.  "

## 2024-03-01 NOTE — Progress Notes (Signed)
 Patient ID: Sarah Stephenson, female   DOB: 1955/08/01, 69 y.o.   MRN: [id number]     SURGICAL PROGRESS NOTE   Hospital Day(s): 4.   Interval History: Patient seen and examined, no acute events or new complaints overnight. Patient reports still pretty uncomfortable due to feeling bloated and not having bowel movement, not passing much gas.  Pain controlled but still expected postsurgical pain.  He denies any nausea or vomiting.  Tolerating diet.  Vital signs in last 24 hours: [min-max] current  Temp:  [98.4 F (36.9 C)-98.7 F (37.1 C)] 98.4 F (36.9 C) (01/11 0827) Pulse Rate:  [77-84] 80 (01/11 0827) Resp:  [15-18] 15 (01/11 0827) BP: (112-133)/(54-67) 112/67 (01/11 0827) SpO2:  [92 %-95 %] 94 % (01/11 0827)     Height: 5' 7 (170.2 cm) Weight: 93.9 kg BMI (Calculated): 32.41   Physical Exam:  Constitutional: alert, cooperative and no distress  Respiratory: breathing non-labored at rest  Cardiovascular: regular rate and sinus rhythm  Gastrointestinal: soft, non-tender, and non-distended.  The wound is dry and clean.  Labs:     Latest Ref Rng & Units 02/28/2024    5:07 AM 02/27/2024    5:13 AM 07/04/2020    2:59 PM  CBC  WBC 4.0 - 10.5 K/uL 9.1  8.7  6.4   Hemoglobin 12.0 - 15.0 g/dL 87.9  87.5  84.7   Hematocrit 36.0 - 46.0 % 37.0  37.9  43.6   Platelets 150 - 400 K/uL 180  195  214       Latest Ref Rng & Units 02/28/2024    5:07 AM 02/27/2024    5:13 AM 07/04/2020    2:59 PM  CMP  Glucose 70 - 99 mg/dL 898  886  94   BUN 8 - 23 mg/dL 21  18  14    Creatinine 0.44 - 1.00 mg/dL 9.18  9.31  9.23   Sodium 135 - 145 mmol/L 139  137  140   Potassium 3.5 - 5.1 mmol/L 4.5  4.8  4.7   Chloride 98 - 111 mmol/L 104  103  101   CO2 22 - 32 mmol/L 26  26  23    Calcium 8.9 - 10.3 mg/dL 8.9  9.3  89.9   Total Protein 6.0 - 8.5 g/dL   7.3   Total Bilirubin 0.0 - 1.2 mg/dL   <9.7   Alkaline Phos 44 - 121 IU/L   82   AST 0 - 40 IU/L   20   ALT 0 - 32 IU/L   17     Imaging studies: No new  pertinent imaging studies   Assessment/Plan:  69 y.o. female with  3 Days Post-Op s/p incisional hernia repair.  - Recovering slowly but adequately - Expected postsurgical pain controlled with current pain medications - Still on adequate GI function - Continue MiraLAX  twice per day - Continue pain medications - Encouraged patient to ambulate  Lucas Petrin, MD

## 2024-03-01 NOTE — Progress Notes (Signed)
 Discharge instructions given to patient, questions answered. IV removed without complication. Patient transporting home via car by her husband. Patients meds were delivered at bedside. Patient to discharge with JP drain, education and new container for output given to patient and husband, expressed understanding.

## 2024-03-03 NOTE — Progress Notes (Signed)
"  ° °  History of Present Illness Sarah Stephenson is a 69 year old female status post incisional hernia repair who presents for postoperative evaluation.  She is in the early postoperative period following incisional hernia repair with bilateral TAR. She has been managing a surgical drain since discharge, with multiple dressing changes due to leakage and the presence of clots. This morning, she noted a large clot in the drain, which she cleared herself, and has not emptied the drain since returning home, attributing the lack of output to the clot. She wrapped the drain in a baggie to prevent leakage and has changed the dressing two to three times since discharge.  She has not emptied the JP bulb since discharge 2 days ago.  Output was 40 cc in 48 hours.  I milked the drain and barely tense more cc came out of serous fluid.  She did not wear her abdominal binder yesterday and experienced increased pain along the lateral abdomen compared to when she wears the binder. She describes ongoing discomfort at the surgical site but denies any worsening of symptoms since discharge. She has been able to shower and is careful with her movements to minimize discomfort. No new or concerning symptoms are reported.   PAST SURGICAL HISTORY:   Past Surgical History:  Procedure Laterality Date   REPAIR INCISIONAL/VENTRAL HERNIA  02/26/2024   Dr Lucas Catchings   COLON SURGERY  02/26/2016   Colostomy/diverticulitis   TONSILLECTOMY  1964         PHYSICAL EXAM:  Vitals:   03/03/24 0942  BP: 124/72  .  Ht:170.2 cm (5' 7) Wt:97.1 kg (214 lb) ADJ:Anib surface area is 2.14 meters squared. Body mass index is 33.52 kg/m.SABRA   GENERAL: Alert, active, oriented x3  ABDOMEN: Soft and depressible, nontender with no palpable mass, no hepatomegaly. Wounds dry and clean.  No erythema.   Assessment & Plan Postoperative care following incisional hernia repair In the early postoperative period following incisional hernia  repair with bilateral TAR, the surgical drain was removed. The incision is healing appropriately with some drainage and a recent clot in the drain. The lower incision appeared slightly granulated and was addressed. She reports expected postoperative discomfort but remains otherwise stable. Antibiotics were prescribed to reduce infection risk post-drain removal. The anticipated course is continued uncomplicated healing. The surgical site was inspected and cleansed, and the lower incision was packed and covered with gauze and tape. She was instructed to keep the dressing in place today and change to new gauze and tape tomorrow, or sooner if saturated. The lower incision does not require further packing, only coverage with gauze. She was advised to keep the area clean and dry and to shower as tolerated. A follow-up is scheduled for Thursday for further evaluation.  Discussed again with patient importance of abstaining from smoking.  Incisional hernia, without obstruction or gangrene [K43.2]  I spent a total of 30 minutes in both face-to-face and non-face-to-face activities, excluding procedures performed, for this visit on the date of this encounter.         Patient verbalized understanding, all questions were answered, and were agreeable with the plan outlined above.   Lucas Sjogren, MD "

## 2024-03-05 ENCOUNTER — Ambulatory Visit: Admitting: Anesthesiology

## 2024-03-05 ENCOUNTER — Encounter (HOSPITAL_COMMUNITY): Payer: Self-pay

## 2024-03-05 ENCOUNTER — Ambulatory Visit: Payer: Self-pay | Admitting: General Surgery

## 2024-03-05 ENCOUNTER — Encounter
Admission: AD | Disposition: A | Discharge status: Home / Self Care | Payer: Self-pay | Source: Ambulatory Visit | Attending: General Surgery

## 2024-03-05 ENCOUNTER — Other Ambulatory Visit: Payer: Self-pay

## 2024-03-05 ENCOUNTER — Inpatient Hospital Stay
Admission: AD | Admit: 2024-03-05 | Discharge: 2024-03-13 | DRG: 907 | Disposition: A | Discharge status: Home / Self Care | Source: Ambulatory Visit | Attending: General Surgery | Admitting: General Surgery

## 2024-03-05 ENCOUNTER — Encounter: Payer: Self-pay | Admitting: General Surgery

## 2024-03-05 DIAGNOSIS — Z882 Allergy status to sulfonamides status: Secondary | ICD-10-CM

## 2024-03-05 DIAGNOSIS — K219 Gastro-esophageal reflux disease without esophagitis: Secondary | ICD-10-CM | POA: Diagnosis present

## 2024-03-05 DIAGNOSIS — Z8261 Family history of arthritis: Secondary | ICD-10-CM | POA: Diagnosis not present

## 2024-03-05 DIAGNOSIS — T8189XA Other complications of procedures, not elsewhere classified, initial encounter: Secondary | ICD-10-CM | POA: Diagnosis present

## 2024-03-05 DIAGNOSIS — E669 Obesity, unspecified: Secondary | ICD-10-CM | POA: Diagnosis present

## 2024-03-05 DIAGNOSIS — F419 Anxiety disorder, unspecified: Secondary | ICD-10-CM | POA: Diagnosis present

## 2024-03-05 DIAGNOSIS — F32A Depression, unspecified: Secondary | ICD-10-CM | POA: Diagnosis present

## 2024-03-05 DIAGNOSIS — Z6833 Body mass index (BMI) 33.0-33.9, adult: Secondary | ICD-10-CM | POA: Diagnosis not present

## 2024-03-05 DIAGNOSIS — Z8249 Family history of ischemic heart disease and other diseases of the circulatory system: Secondary | ICD-10-CM

## 2024-03-05 DIAGNOSIS — F17210 Nicotine dependence, cigarettes, uncomplicated: Secondary | ICD-10-CM | POA: Diagnosis present

## 2024-03-05 DIAGNOSIS — T8131XA Disruption of external operation (surgical) wound, not elsewhere classified, initial encounter: Secondary | ICD-10-CM | POA: Diagnosis present

## 2024-03-05 DIAGNOSIS — Z8349 Family history of other endocrine, nutritional and metabolic diseases: Secondary | ICD-10-CM

## 2024-03-05 DIAGNOSIS — Z8616 Personal history of COVID-19: Secondary | ICD-10-CM

## 2024-03-05 DIAGNOSIS — Z888 Allergy status to other drugs, medicaments and biological substances status: Secondary | ICD-10-CM

## 2024-03-05 DIAGNOSIS — S31603A Unspecified open wound of abdominal wall, right lower quadrant with penetration into peritoneal cavity, initial encounter: Secondary | ICD-10-CM | POA: Diagnosis present

## 2024-03-05 DIAGNOSIS — E785 Hyperlipidemia, unspecified: Secondary | ICD-10-CM | POA: Diagnosis present

## 2024-03-05 DIAGNOSIS — Z885 Allergy status to narcotic agent status: Secondary | ICD-10-CM

## 2024-03-05 DIAGNOSIS — Z811 Family history of alcohol abuse and dependence: Secondary | ICD-10-CM | POA: Diagnosis not present

## 2024-03-05 DIAGNOSIS — L409 Psoriasis, unspecified: Secondary | ICD-10-CM | POA: Diagnosis present

## 2024-03-05 DIAGNOSIS — Z8262 Family history of osteoporosis: Secondary | ICD-10-CM

## 2024-03-05 DIAGNOSIS — Z818 Family history of other mental and behavioral disorders: Secondary | ICD-10-CM | POA: Diagnosis not present

## 2024-03-05 DIAGNOSIS — S31109A Unspecified open wound of abdominal wall, unspecified quadrant without penetration into peritoneal cavity, initial encounter: Principal | ICD-10-CM | POA: Diagnosis present

## 2024-03-05 HISTORY — PX: IRRIGATION AND DEBRIDEMENT ABSCESS: SHX5252

## 2024-03-05 MED ORDER — FENTANYL CITRATE (PF) 100 MCG/2ML IJ SOLN
INTRAMUSCULAR | Status: DC | PRN
  Administered 2024-03-05: 25 ug via INTRAVENOUS

## 2024-03-05 MED ORDER — ONDANSETRON HCL 4 MG/2ML IJ SOLN
INTRAMUSCULAR | Status: AC
  Filled 2024-03-05: qty 2

## 2024-03-05 MED ORDER — DROPERIDOL 2.5 MG/ML IJ SOLN: 0.6250 mg | Freq: Once | INTRAMUSCULAR | Status: DC | PRN

## 2024-03-05 MED ORDER — LACTATED RINGERS IV SOLN: INTRAVENOUS | Status: DC

## 2024-03-05 MED ORDER — LEVOTHYROXINE SODIUM 137 MCG PO TABS
137.0000 ug | ORAL_TABLET | Freq: Every day | ORAL | Status: DC
  Administered 2024-03-06 – 2024-03-13 (×8): 137 ug via ORAL
  Filled 2024-03-05 (×8): qty 1

## 2024-03-05 MED ORDER — ONDANSETRON HCL 4 MG/2ML IJ SOLN
INTRAMUSCULAR | Status: DC | PRN
  Administered 2024-03-05: 4 mg via INTRAVENOUS

## 2024-03-05 MED ORDER — 0.9 % SODIUM CHLORIDE (POUR BTL) OPTIME
TOPICAL | Status: DC | PRN
  Administered 2024-03-05: 3000 mL

## 2024-03-05 MED ORDER — ALBUTEROL SULFATE (2.5 MG/3ML) 0.083% IN NEBU: 2.5000 mg | INHALATION_SOLUTION | RESPIRATORY_TRACT | Status: DC | PRN

## 2024-03-05 MED ORDER — LIDOCAINE HCL (CARDIAC) PF 100 MG/5ML IV SOSY
PREFILLED_SYRINGE | INTRAVENOUS | Status: DC | PRN
  Administered 2024-03-05: 100 mg via INTRAVENOUS

## 2024-03-05 MED ORDER — CHLORHEXIDINE GLUCONATE 0.12 % MT SOLN
OROMUCOSAL | Status: AC
  Filled 2024-03-05: qty 15

## 2024-03-05 MED ORDER — PROPOFOL 10 MG/ML IV BOLUS
INTRAVENOUS | Status: DC | PRN
  Administered 2024-03-05: 170 mg via INTRAVENOUS

## 2024-03-05 MED ORDER — MOMETASONE FUROATE 0.1 % EX CREA: 1.0000 | TOPICAL_CREAM | Freq: Every day | CUTANEOUS | Status: DC | PRN

## 2024-03-05 MED ORDER — PHENYLEPHRINE 80 MCG/ML (10ML) SYRINGE FOR IV PUSH (FOR BLOOD PRESSURE SUPPORT)
PREFILLED_SYRINGE | INTRAVENOUS | Status: DC | PRN
  Administered 2024-03-05: 80 ug via INTRAVENOUS

## 2024-03-05 MED ORDER — IRRISEPT - 450ML BOTTLE WITH 0.05% CHG IN STERILE WATER, USP 99.95% OPTIME
TOPICAL | Status: DC | PRN
  Administered 2024-03-05: 450 mL

## 2024-03-05 MED ORDER — ORAL CARE MOUTH RINSE: 15.0000 mL | Freq: Once | OROMUCOSAL | Status: AC

## 2024-03-05 MED ORDER — EPHEDRINE 5 MG/ML INJ
INTRAVENOUS | Status: AC
  Filled 2024-03-05: qty 5

## 2024-03-05 MED ORDER — DEXAMETHASONE SOD PHOSPHATE PF 10 MG/ML IJ SOLN
INTRAMUSCULAR | Status: AC
  Filled 2024-03-05: qty 1

## 2024-03-05 MED ORDER — PANTOPRAZOLE SODIUM 40 MG PO TBEC
40.0000 mg | DELAYED_RELEASE_TABLET | Freq: Every day | ORAL | Status: DC
  Administered 2024-03-05 – 2024-03-13 (×9): 40 mg via ORAL
  Filled 2024-03-05 (×9): qty 1

## 2024-03-05 MED ORDER — CEFAZOLIN SODIUM-DEXTROSE 2-4 GM/100ML-% IV SOLN
2.0000 g | INTRAVENOUS | Status: AC
  Administered 2024-03-05: 2 g via INTRAVENOUS

## 2024-03-05 MED ORDER — CEFAZOLIN SODIUM-DEXTROSE 2-4 GM/100ML-% IV SOLN
INTRAVENOUS | Status: AC
  Filled 2024-03-05: qty 100

## 2024-03-05 MED ORDER — BUPROPION HCL ER (XL) 150 MG PO TB24
150.0000 mg | ORAL_TABLET | Freq: Every day | ORAL | Status: DC
  Administered 2024-03-06 – 2024-03-13 (×8): 150 mg via ORAL
  Filled 2024-03-05 (×8): qty 1

## 2024-03-05 MED ORDER — MORPHINE SULFATE (PF) 4 MG/ML IV SOLN: 4.0000 mg | INTRAVENOUS | Status: DC | PRN

## 2024-03-05 MED ORDER — DEXAMETHASONE SOD PHOSPHATE PF 10 MG/ML IJ SOLN
INTRAMUSCULAR | Status: DC | PRN
  Administered 2024-03-05: 10 mg via INTRAVENOUS

## 2024-03-05 MED ORDER — PHENYLEPHRINE 80 MCG/ML (10ML) SYRINGE FOR IV PUSH (FOR BLOOD PRESSURE SUPPORT)
PREFILLED_SYRINGE | INTRAVENOUS | Status: AC
  Filled 2024-03-05: qty 10

## 2024-03-05 MED ORDER — BENZONATATE 100 MG PO CAPS: 100.0000 mg | ORAL_CAPSULE | Freq: Three times a day (TID) | ORAL | Status: DC | PRN

## 2024-03-05 MED ORDER — ONDANSETRON HCL 4 MG/2ML IJ SOLN
4.0000 mg | Freq: Four times a day (QID) | INTRAMUSCULAR | Status: DC | PRN
  Administered 2024-03-09: 4 mg via INTRAVENOUS

## 2024-03-05 MED ORDER — HYDROMORPHONE HCL 1 MG/ML IJ SOLN: 0.2500 mg | INTRAMUSCULAR | Status: DC | PRN

## 2024-03-05 MED ORDER — HYDROCODONE-ACETAMINOPHEN 5-325 MG PO TABS
1.0000 | ORAL_TABLET | ORAL | Status: DC | PRN
  Administered 2024-03-05: 2 via ORAL
  Filled 2024-03-05: qty 2

## 2024-03-05 MED ORDER — ALBUTEROL SULFATE HFA 108 (90 BASE) MCG/ACT IN AERS: 2.0000 | INHALATION_SPRAY | RESPIRATORY_TRACT | Status: DC | PRN

## 2024-03-05 MED ORDER — PROPOFOL 10 MG/ML IV BOLUS
INTRAVENOUS | Status: AC
  Filled 2024-03-05: qty 20

## 2024-03-05 MED ORDER — ROCURONIUM BROMIDE 10 MG/ML (PF) SYRINGE
PREFILLED_SYRINGE | INTRAVENOUS | Status: AC
  Filled 2024-03-05: qty 10

## 2024-03-05 MED ORDER — CHLORHEXIDINE GLUCONATE 0.12 % MT SOLN
15.0000 mL | Freq: Once | OROMUCOSAL | Status: AC
  Administered 2024-03-05: 15 mL via OROMUCOSAL

## 2024-03-05 MED ORDER — AMLODIPINE BESYLATE 5 MG PO TABS
5.0000 mg | ORAL_TABLET | Freq: Every day | ORAL | Status: DC
  Administered 2024-03-06 – 2024-03-13 (×8): 5 mg via ORAL
  Filled 2024-03-05 (×8): qty 1

## 2024-03-05 MED ORDER — PIPERACILLIN-TAZOBACTAM 3.375 G IVPB
3.3750 g | Freq: Three times a day (TID) | INTRAVENOUS | Status: AC
  Administered 2024-03-05 – 2024-03-10 (×15): 3.375 g via INTRAVENOUS
  Filled 2024-03-05 (×14): qty 50

## 2024-03-05 MED ORDER — PHENYLEPHRINE HCL (PRESSORS) 10 MG/ML IV SOLN
INTRAVENOUS | Status: DC | PRN
  Administered 2024-03-05: 80 ug via INTRAVENOUS

## 2024-03-05 MED ORDER — ONDANSETRON 4 MG PO TBDP: 4.0000 mg | ORAL_TABLET | Freq: Four times a day (QID) | ORAL | Status: DC | PRN

## 2024-03-05 MED ORDER — FENTANYL CITRATE (PF) 100 MCG/2ML IJ SOLN
INTRAMUSCULAR | Status: AC
  Filled 2024-03-05: qty 2

## 2024-03-05 MED ORDER — ENOXAPARIN SODIUM 40 MG/0.4ML IJ SOSY
40.0000 mg | PREFILLED_SYRINGE | INTRAMUSCULAR | Status: DC
  Administered 2024-03-06 – 2024-03-13 (×7): 40 mg via SUBCUTANEOUS
  Filled 2024-03-05 (×7): qty 0.4

## 2024-03-05 MED ORDER — EPHEDRINE SULFATE-NACL 50-0.9 MG/10ML-% IV SOSY
PREFILLED_SYRINGE | INTRAVENOUS | Status: DC | PRN
  Administered 2024-03-05 (×2): 5 mg via INTRAVENOUS

## 2024-03-05 MED ORDER — MIDAZOLAM HCL (PF) 2 MG/2ML IJ SOLN
INTRAMUSCULAR | Status: DC | PRN
  Administered 2024-03-05: 2 mg via INTRAVENOUS

## 2024-03-05 MED ORDER — MIDAZOLAM HCL 2 MG/2ML IJ SOLN
INTRAMUSCULAR | Status: AC
  Filled 2024-03-05: qty 2

## 2024-03-05 MED ORDER — BUPIVACAINE-EPINEPHRINE (PF) 0.25% -1:200000 IJ SOLN
INTRAMUSCULAR | Status: AC
  Filled 2024-03-05: qty 30

## 2024-03-05 MED ORDER — LACTATED RINGERS IV SOLN: INTRAVENOUS | Status: DC | PRN

## 2024-03-05 MED ORDER — ACETAMINOPHEN 10 MG/ML IV SOLN: 1000.0000 mg | Freq: Once | INTRAVENOUS | Status: DC | PRN

## 2024-03-05 MED ORDER — CELECOXIB 200 MG PO CAPS
200.0000 mg | ORAL_CAPSULE | Freq: Two times a day (BID) | ORAL | Status: DC
  Administered 2024-03-05 – 2024-03-13 (×16): 200 mg via ORAL
  Filled 2024-03-05 (×16): qty 1

## 2024-03-05 MED ORDER — ALBUTEROL SULFATE HFA 108 (90 BASE) MCG/ACT IN AERS
INHALATION_SPRAY | RESPIRATORY_TRACT | Status: AC
  Filled 2024-03-05: qty 6.7

## 2024-03-05 NOTE — Interval H&P Note (Signed)
 History and Physical Interval Note:  03/05/2024 4:26 PM  Sarah Stephenson  has presented today for surgery, with the diagnosis of K43.2 incisional hernia w/o obstruction or gangrene  T81.31XS wound dehiscence, surgical.  The various methods of treatment have been discussed with the patient and family. After consideration of risks, benefits and other options for treatment, the patient has consented to  Procedures: IRRIGATION AND DEBRIDEMENT ABSCESS (N/A) as a surgical intervention.  The patient's history has been reviewed, patient examined, no change in status, stable for surgery.  I have reviewed the patient's chart and labs.  Questions were answered to the patient's satisfaction.     Lucas Sjogren

## 2024-03-05 NOTE — Op Note (Signed)
 OPERATION REPORT  Pre Operative Diagnosis: Suspected wound infection  Post operative diagnosis: Suspected wound infection  Anesthesia: LMA   Surgeon: Dr. Rodolph   Indication: This 69 y.o. year old female with recent incisional hernia repair with mesh that came with persistent drainage from previous drain exit wound.  Exploration of the wound was indicated to rule out deeper infection.  Findings: No deep abscess on the midline wound   Description of procedure: after orienting patient about the procedure steps and benefits and patient agreed to proceed. Time out was done identifying correct patient and location of procedure. After induction of anesthesia, the skin staples on the midline were removed.  The the anterior flap of the abdominal wall was found without any sign of abscess.  Serous fluid was aspirated.  From the previous exit wound of the drain, creamy drainage was identified.  This wound was extended and aspirated without any form abscess.  All the wounds were irrigated with 3 L of saline and 1 bottle of Irrisept.  And negative pressure dressing (DME) was placed covering a 16 x 11 cm (176 cm area).  No sign of enteric content or deeper infection was identified.  Procedure was terminated the patient tolerated the procedure well.  Will admit to inpatient for further wound management.   Complications: none   EBL: minimal  Lucas Rodolph, MD, FACS

## 2024-03-05 NOTE — Anesthesia Procedure Notes (Signed)
 Procedure Name: LMA Insertion Date/Time: 03/05/2024 4:51 PM  Performed by: Dorcus Juliene SAILOR, CRNAPre-anesthesia Checklist: Patient identified, Emergency Drugs available, Suction available and Patient being monitored Patient Re-evaluated:Patient Re-evaluated prior to induction Oxygen Delivery Method: Circle system utilized Preoxygenation: Pre-oxygenation with 100% oxygen Induction Type: IV induction Ventilation: Mask ventilation without difficulty LMA: LMA inserted LMA Size: 4.0 Number of attempts: 1

## 2024-03-05 NOTE — Progress Notes (Signed)
 " History of Present Illness Sarah Stephenson is a 69 year old female status post incisional hernia repair who presents for evaluation of postoperative wound complications and acute pain.  The patient underwent incisional hernia repair on February 26, 2024, and had her surgical drain removed two days ago. Since her last visit, she has replaced the wound gauze four times and is concerned about the color and character of drainage from the former drain site, describing it as abnormal and expressing concern for possible infection, particularly due to the presence of mesh.  She reports intermittent stabbing pain at the wound site. She has not taken gabapentin  or hydrocodone  today but continues her antibiotics. She is considering acetaminophen  for pain control and prefers to avoid stronger analgesics due to concerns about nausea. She remains ambulatory, walking around the house to maintain mobility.  She experiences persistent dizziness throughout the day and mild nausea without vomiting. She is able to tolerate oral intake, having consumed yogurt in the morning. Bowel movements are regular, occurring a couple of times per day. She denies fever.      PAST MEDICAL HISTORY:  Past Medical History:  Diagnosis Date   Allergy 1982   Medications  Seasonal   Anxiety    Intermittent   Arthritis 2015   Psoriatic   Bronchitis, chronic (CMS/HHS-HCC)    Depression 1993   Post partum,bereavement   GERD (gastroesophageal reflux disease) Occasionally   Hyperlipidemia 2023   Obesity    Sinusitis, unspecified    Sleep apnea 2017   Thyroid  disease 2004   Hyperthyroidism        PAST SURGICAL HISTORY:   Past Surgical History:  Procedure Laterality Date   REPAIR INCISIONAL/VENTRAL HERNIA  02/26/2024   Dr Lucas Catchings   COLON SURGERY  02/26/2016   Colostomy/diverticulitis   TONSILLECTOMY  1964         MEDICATIONS:  Outpatient Encounter Medications as of 03/05/2024  Medication Sig Dispense  Refill   amLODIPine  (NORVASC ) 5 MG tablet Take 1 tablet (5 mg total) by mouth once daily 30 tablet 1   amoxicillin-clavulanate (AUGMENTIN) 875-125 mg tablet Take 1 tablet (875 mg total) by mouth every 12 (twelve) hours for 7 days 14 tablet 0   buPROPion  (WELLBUTRIN  XL) 150 MG XL tablet Take 1 tablet (150 mg total) by mouth once daily 30 tablet 1   esomeprazole magnesium (NEXIUM ORAL) Take by mouth once daily as needed     ezetimibe (ZETIA) 10 mg tablet Take 1 tablet (10 mg total) by mouth once daily 30 tablet 11   gabapentin  (NEURONTIN ) 300 MG capsule Take 300 mg by mouth 2 (two) times daily     guselkumab  (TREMFYA  ONE-PRESS) 100 mg/mL auto-injector Inject 1 mL subcutaneously     HYDROcodone -acetaminophen  (NORCO) 5-325 mg tablet Take 1 tablet by mouth every 6 (six) hours as needed     ibuprofen (MOTRIN) 600 MG tablet      levothyroxine  (SYNTHROID ) 137 MCG tablet TAKE 1 TABLET BY MOUTH EVERY MORNING BEFORE BREAKFAST, EMPTY STOMACH WITH GLASS OF WATER  BEFORE BREAKFAST 90 tablet 1   methocarbamoL  (ROBAXIN ) 500 MG tablet Take 500 mg by mouth     mometasone  (ELOCON ) 0.1 % cream Apply 1 Application topically once daily     ixekizumab  (TALTZ  AUTOINJECTOR) 80 mg/mL AtIn Inject subcutaneously monthly (Patient not taking: Reported on 02/04/2024)     No facility-administered encounter medications on file as of 03/05/2024.     ALLERGIES:   Oxycodone , Sulfa (sulfonamide antibiotics), and Venlafaxine  SOCIAL HISTORY:  Social History   Socioeconomic History   Marital status: Married  Occupational History   Occupation: Working at [employer]  Tobacco Use   Smoking status: Every Day    Current packs/day: 0.50    Types: Cigarettes    Passive exposure: Current   Smokeless tobacco: Never  Vaping Use   Vaping status: Never Used  Substance and Sexual Activity   Alcohol use: Yes    Alcohol/week: 9.0 standard drinks of alcohol    Types: 9 Cans of beer per week   Drug use: Never    Sexual activity: Yes    Partners: Male    Birth control/protection: Post-menopausal   Social Drivers of Corporate Investment Banker Strain: Low Risk  (01/08/2024)   Overall Financial Resource Strain (CARDIA)    Difficulty of Paying Living Expenses: Not hard at all  Food Insecurity: No Food Insecurity (02/26/2024)   Received from [facility]   Hunger Vital Sign    Within the past 12 months, you worried that your food would run out before you got the money to buy more.: Never true    Within the past 12 months, the food you bought just didn't last and you didn't have money to get more.: Never true  Transportation Needs: No Transportation Needs (02/26/2024)   Received from [facility] - Transportation    In the past 12 months, has lack of transportation kept you from medical appointments or from getting medications?: No    In the past 12 months, has lack of transportation kept you from meetings, work, or from getting things needed for daily living?: No    FAMILY HISTORY:  Family History  Problem Relation Name Age of Onset   Arthritis Father Clem    Alcohol abuse Father Clem    Hyperlipidemia (Elevated cholesterol) Father Albert    High blood pressure (Hypertension) Mother Leonie    Osteoporosis (Thinning of bones) Mother Leonie    Thyroid  disease Mother Leonie    Alcohol abuse Son Oliva    Anxiety Son Oliva    Depression Son Oliva    Anxiety Daughter Damien    Depression Daughter Damien    Anxiety Daughter Reche    Thyroid  disease Brother Lamar    Thyroid  disease Sister Administrator, Sports      GENERAL REVIEW OF SYSTEMS:   General ROS: negative for - positive for fatigue Allergy and Immunology ROS: negative for - hives  Hematological and Lymphatic ROS: negative for - bleeding problems or bruising, negative for palpable nodes Endocrine ROS: negative for - heat or cold intolerance, hair changes Respiratory ROS: negative for - cough, shortness of breath or  wheezing Cardiovascular ROS: no chest pain or palpitations GI ROS: negative for nausea, vomiting, positive for abdominal pain Musculoskeletal ROS: negative for - joint swelling or muscle pain Neurological ROS: negative for - confusion, syncope Dermatological ROS: negative for pruritus and rash  PHYSICAL EXAM:  Vitals:   03/05/24 1002  BP: 120/67  Pulse: 77  .  Ht:170.2 cm (5' 7) Wt:97.1 kg (214 lb) ADJ:Anib surface area is 2.14 meters squared. Body mass index is 33.52 kg/m.SABRA   GENERAL: Alert, active, oriented x3  HEENT: Pupils equal reactive to light. Extraocular movements are intact. Sclera clear. Palpebral conjunctiva normal red color.Pharynx clear.  NECK: Supple with no palpable mass and no adenopathy.  LUNGS: Sound clear with no rales rhonchi or wheezes.  HEART: Regular rhythm S1 and S2 without murmur.  ABDOMEN: Soft and depressible.  Midline wound with no cellulitis but with creamy drainage through the right lower quadrant wound (previus drain exit wound).   EXTREMITIES: Well-developed well-nourished symmetrical with no dependent edema.  NEUROLOGICAL: Awake alert oriented, facial expression symmetrical, moving all extremities.    Assessment & Plan Postoperative wound complication following incisional hernia repair Following recent incisional hernia repair and drain removal, she presents with abnormal wound drainage and discoloration at the prior drain site, raising concerns for postoperative wound infection with potential mesh involvement. This could lead to significant morbidity if not promptly addressed. Dizziness and mild nausea may be related to the wound complication or recent surgery. Urgent return to the operating room tonight is recommended for wound exploration, irrigation, and placement of a wound VAC dressing to facilitate drainage and healing hoping to avoid deep infection. The wound should remain open to allow for drainage and prevent fluid accumulation. Hospital  admission is planned for at least overnight observation, with possible extension based on intraoperative findings and postoperative course. Specialized wound care nursing is arranged for wound VAC management at home, with dressing changes two to three times per week. She is instructed to remain NPO except for sips of water  until surgery. Timing and logistics for surgery and postoperative care are coordinated.  Acute postoperative pain She reports ongoing pain at the surgical site with intermittent stabbing sensations and prefers to avoid gabapentin  and hydrocodone , considering acetaminophen  for pain control. Analgesics may contribute to nausea, so minimizing their use is preferred. Gabapentin  and hydrocodone  are held per her preference. Hydrocodone  is recommended only as needed for pain, and a trial of acetaminophen  for pain control is advised today. It is discussed that analgesics may contribute to nausea and should be avoided if not needed.   Wound dehiscence, surgical, sequela [T81.31XS]          Patient verbalized understanding, all questions were answered, and were agreeable with the plan outlined above.   Lucas Sjogren, MD  Electronically signed by Lucas Sjogren, MD "

## 2024-03-05 NOTE — Anesthesia Postprocedure Evaluation (Signed)
"   Anesthesia Post Note  Patient: Sarah Stephenson  Procedure(s) Performed: IRRIGATION AND DEBRIDEMENT ABSCESS (Abdomen)  Patient location during evaluation: PACU Anesthesia Type: General Level of consciousness: awake and alert Pain management: pain level controlled Vital Signs Assessment: post-procedure vital signs reviewed and stable Respiratory status: spontaneous breathing, nonlabored ventilation and respiratory function stable Cardiovascular status: blood pressure returned to baseline and stable Postop Assessment: no apparent nausea or vomiting Anesthetic complications: no   There were no known notable events for this encounter.   Last Vitals:  Vitals:   03/05/24 1830 03/05/24 2109  BP: 105/65 (!) 110/59  Pulse: 71 74  Resp: 15 18  Temp:  36.6 C  SpO2: 92% 93%    Last Pain:  Vitals:   03/05/24 2139  TempSrc:   PainSc: 6                  Allyanna Appleman Merilee Louder      "

## 2024-03-05 NOTE — Transfer of Care (Signed)
 Immediate Anesthesia Transfer of Care Note  Patient: Sarah Stephenson  Procedure(s) Performed: IRRIGATION AND DEBRIDEMENT ABSCESS (Abdomen)  Patient Location: PACU  Anesthesia Type:General  Level of Consciousness: awake, alert , and oriented  Airway & Oxygen Therapy: Patient Spontanous Breathing and Patient connected to face mask oxygen  Post-op Assessment: Report given to RN and Post -op Vital signs reviewed and stable  Post vital signs: Reviewed and stable  Last Vitals:  Vitals Value Taken Time  BP 127/59 03/05/24 17:45  Temp    Pulse 77 03/05/24 17:48  Resp 20 03/05/24 17:48  SpO2 100 % 03/05/24 17:48  Vitals shown include unfiled device data.  Last Pain:  Vitals:   03/05/24 1544  TempSrc: Temporal  PainSc: 5          Complications: There were no known notable events for this encounter.

## 2024-03-05 NOTE — H&P (View-Only) (Signed)
 " History of Present Illness Sarah Stephenson is a 69 year old female status post incisional hernia repair who presents for evaluation of postoperative wound complications and acute pain.  The patient underwent incisional hernia repair on February 26, 2024, and had her surgical drain removed two days ago. Since her last visit, she has replaced the wound gauze four times and is concerned about the color and character of drainage from the former drain site, describing it as abnormal and expressing concern for possible infection, particularly due to the presence of mesh.  She reports intermittent stabbing pain at the wound site. She has not taken gabapentin  or hydrocodone  today but continues her antibiotics. She is considering acetaminophen  for pain control and prefers to avoid stronger analgesics due to concerns about nausea. She remains ambulatory, walking around the house to maintain mobility.  She experiences persistent dizziness throughout the day and mild nausea without vomiting. She is able to tolerate oral intake, having consumed yogurt in the morning. Bowel movements are regular, occurring a couple of times per day. She denies fever.      PAST MEDICAL HISTORY:  Past Medical History:  Diagnosis Date   Allergy 1982   Medications  Seasonal   Anxiety    Intermittent   Arthritis 2015   Psoriatic   Bronchitis, chronic (CMS/HHS-HCC)    Depression 1993   Post partum,bereavement   GERD (gastroesophageal reflux disease) Occasionally   Hyperlipidemia 2023   Obesity    Sinusitis, unspecified    Sleep apnea 2017   Thyroid  disease 2004   Hyperthyroidism        PAST SURGICAL HISTORY:   Past Surgical History:  Procedure Laterality Date   REPAIR INCISIONAL/VENTRAL HERNIA  02/26/2024   Dr Lucas Catchings   COLON SURGERY  02/26/2016   Colostomy/diverticulitis   TONSILLECTOMY  1964         MEDICATIONS:  Outpatient Encounter Medications as of 03/05/2024  Medication Sig Dispense  Refill   amLODIPine  (NORVASC ) 5 MG tablet Take 1 tablet (5 mg total) by mouth once daily 30 tablet 1   amoxicillin-clavulanate (AUGMENTIN) 875-125 mg tablet Take 1 tablet (875 mg total) by mouth every 12 (twelve) hours for 7 days 14 tablet 0   buPROPion  (WELLBUTRIN  XL) 150 MG XL tablet Take 1 tablet (150 mg total) by mouth once daily 30 tablet 1   esomeprazole magnesium (NEXIUM ORAL) Take by mouth once daily as needed     ezetimibe (ZETIA) 10 mg tablet Take 1 tablet (10 mg total) by mouth once daily 30 tablet 11   gabapentin  (NEURONTIN ) 300 MG capsule Take 300 mg by mouth 2 (two) times daily     guselkumab  (TREMFYA  ONE-PRESS) 100 mg/mL auto-injector Inject 1 mL subcutaneously     HYDROcodone -acetaminophen  (NORCO) 5-325 mg tablet Take 1 tablet by mouth every 6 (six) hours as needed     ibuprofen (MOTRIN) 600 MG tablet      levothyroxine  (SYNTHROID ) 137 MCG tablet TAKE 1 TABLET BY MOUTH EVERY MORNING BEFORE BREAKFAST, EMPTY STOMACH WITH GLASS OF WATER  BEFORE BREAKFAST 90 tablet 1   methocarbamoL  (ROBAXIN ) 500 MG tablet Take 500 mg by mouth     mometasone  (ELOCON ) 0.1 % cream Apply 1 Application topically once daily     ixekizumab  (TALTZ  AUTOINJECTOR) 80 mg/mL AtIn Inject subcutaneously monthly (Patient not taking: Reported on 02/04/2024)     No facility-administered encounter medications on file as of 03/05/2024.     ALLERGIES:   Oxycodone , Sulfa (sulfonamide antibiotics), and Venlafaxine  SOCIAL HISTORY:  Social History   Socioeconomic History   Marital status: Married  Occupational History   Occupation: Working at [employer]  Tobacco Use   Smoking status: Every Day    Current packs/day: 0.50    Types: Cigarettes    Passive exposure: Current   Smokeless tobacco: Never  Vaping Use   Vaping status: Never Used  Substance and Sexual Activity   Alcohol use: Yes    Alcohol/week: 9.0 standard drinks of alcohol    Types: 9 Cans of beer per week   Drug use: Never    Sexual activity: Yes    Partners: Male    Birth control/protection: Post-menopausal   Social Drivers of Corporate Investment Banker Strain: Low Risk  (01/08/2024)   Overall Financial Resource Strain (CARDIA)    Difficulty of Paying Living Expenses: Not hard at all  Food Insecurity: No Food Insecurity (02/26/2024)   Received from [facility]   Hunger Vital Sign    Within the past 12 months, you worried that your food would run out before you got the money to buy more.: Never true    Within the past 12 months, the food you bought just didn't last and you didn't have money to get more.: Never true  Transportation Needs: No Transportation Needs (02/26/2024)   [facility]    In the past 12 months, has lack of transportation kept you from medical appointments or from getting medications?: No    In the past 12 months, has lack of transportation kept you from meetings, work, or from getting things needed for daily living?: No    FAMILY HISTORY:  Family History  Problem Relation Name Age of Onset   Arthritis Father Clem    Alcohol abuse Father Clem    Hyperlipidemia (Elevated cholesterol) Father Albert    High blood pressure (Hypertension) Mother Leonie    Osteoporosis (Thinning of bones) Mother Leonie    Thyroid  disease Mother Leonie    Alcohol abuse Son Oliva    Anxiety Son Oliva    Depression Son Oliva    Anxiety Daughter Damien    Depression Daughter Damien    Anxiety Daughter Caitlin    Thyroid  disease Brother Lamar    Thyroid  disease Sister Administrator, Sports      GENERAL REVIEW OF SYSTEMS:   General ROS: negative for - positive for fatigue Allergy and Immunology ROS: negative for - hives  Hematological and Lymphatic ROS: negative for - bleeding problems or bruising, negative for palpable nodes Endocrine ROS: negative for - heat or cold intolerance, hair changes Respiratory ROS: negative for - cough, shortness of breath or  wheezing Cardiovascular ROS: no chest pain or palpitations GI ROS: negative for nausea, vomiting, positive for abdominal pain Musculoskeletal ROS: negative for - joint swelling or muscle pain Neurological ROS: negative for - confusion, syncope Dermatological ROS: negative for pruritus and rash  PHYSICAL EXAM:  Vitals:   03/05/24 1002  BP: 120/67  Pulse: 77  .  Ht:170.2 cm (5' 7) Wt:97.1 kg (214 lb) ADJ:Anib surface area is 2.14 meters squared. Body mass index is 33.52 kg/m.SABRA   GENERAL: Alert, active, oriented x3  HEENT: Pupils equal reactive to light. Extraocular movements are intact. Sclera clear. Palpebral conjunctiva normal red color.Pharynx clear.  NECK: Supple with no palpable mass and no adenopathy.  LUNGS: Sound clear with no rales rhonchi or wheezes.  HEART: Regular rhythm S1 and S2 without murmur.  ABDOMEN: Soft and depressible.  Midline wound with no cellulitis but with creamy drainage through the right lower quadrant wound (previus drain exit wound).   EXTREMITIES: Well-developed well-nourished symmetrical with no dependent edema.  NEUROLOGICAL: Awake alert oriented, facial expression symmetrical, moving all extremities.    Assessment & Plan Postoperative wound complication following incisional hernia repair Following recent incisional hernia repair and drain removal, she presents with abnormal wound drainage and discoloration at the prior drain site, raising concerns for postoperative wound infection with potential mesh involvement. This could lead to significant morbidity if not promptly addressed. Dizziness and mild nausea may be related to the wound complication or recent surgery. Urgent return to the operating room tonight is recommended for wound exploration, irrigation, and placement of a wound VAC dressing to facilitate drainage and healing hoping to avoid deep infection. The wound should remain open to allow for drainage and prevent fluid accumulation. Hospital  admission is planned for at least overnight observation, with possible extension based on intraoperative findings and postoperative course. Specialized wound care nursing is arranged for wound VAC management at home, with dressing changes two to three times per week. She is instructed to remain NPO except for sips of water  until surgery. Timing and logistics for surgery and postoperative care are coordinated.  Acute postoperative pain She reports ongoing pain at the surgical site with intermittent stabbing sensations and prefers to avoid gabapentin  and hydrocodone , considering acetaminophen  for pain control. Analgesics may contribute to nausea, so minimizing their use is preferred. Gabapentin  and hydrocodone  are held per her preference. Hydrocodone  is recommended only as needed for pain, and a trial of acetaminophen  for pain control is advised today. It is discussed that analgesics may contribute to nausea and should be avoided if not needed.   Wound dehiscence, surgical, sequela [T81.31XS]          Patient verbalized understanding, all questions were answered, and were agreeable with the plan outlined above.   Lucas Sjogren, MD  Electronically signed by Lucas Sjogren, MD "

## 2024-03-05 NOTE — Anesthesia Preprocedure Evaluation (Addendum)
"                                    Anesthesia Evaluation  Patient identified by MRN, date of birth, ID band Patient awake    Reviewed: Allergy & Precautions, H&P , NPO status , Patient's Chart, lab work & pertinent test results, reviewed documented beta blocker date and time   Airway Mallampati: III  TM Distance: >3 FB Neck ROM: full    Dental  (+) Teeth Intact   Pulmonary shortness of breath and with exertion, sleep apnea and Continuous Positive Airway Pressure Ventilation , Current Smoker and Patient abstained from smoking.   Pulmonary exam normal        Cardiovascular Exercise Tolerance: Poor hypertension, On Medications Normal cardiovascular exam Rhythm:regular Rate:Normal     Neuro/Psych  Headaches PSYCHIATRIC DISORDERS  Depression       GI/Hepatic Neg liver ROS,GERD  Medicated,,  Endo/Other  Hypothyroidism    Renal/GU negative Renal ROS  negative genitourinary   Musculoskeletal   Abdominal   Peds  Hematology  (+) Blood dyscrasia, anemia   Anesthesia Other Findings Past Medical History: No date: Allergy No date: Anemia No date: Depression No date: Diverticulitis No date: Dyspnea No date: GERD (gastroesophageal reflux disease) No date: Headache No date: Hypertension No date: Pneumonia No date: Psoriasis No date: Psoriatic arthritis (HCC) No date: Sleep apnea No date: Thyroid  disease Past Surgical History: No date: BUNIONECTOMY No date: COLON SURGERY     Comment:  partial colostomy creation due to diverticulitis in 2018 No date: COLOSTOMY TAKEDOWN No date: EYE SURGERY No date: TONSILLECTOMY No date: WISDOM TOOTH EXTRACTION BMI    Body Mass Index: 32.42 kg/m     Reproductive/Obstetrics negative OB ROS                              Anesthesia Physical Anesthesia Plan  ASA: 3  Anesthesia Plan: General   Post-op Pain Management: Tylenol  PO (pre-op)*   Induction: Intravenous  PONV Risk Score and  Plan: 3 and Ondansetron , Droperidol  and Propofol  infusion  Airway Management Planned: LMA  Additional Equipment:   Intra-op Plan:   Post-operative Plan: Extubation in OR  Informed Consent: I have reviewed the patients History and Physical, chart, labs and discussed the procedure including the risks, benefits and alternatives for the proposed anesthesia with the patient or authorized representative who has indicated his/her understanding and acceptance.     Dental Advisory Given  Plan Discussed with: CRNA  Anesthesia Plan Comments:          Anesthesia Quick Evaluation  "

## 2024-03-05 NOTE — Patient Instructions (Signed)
 VISIT SUMMARY: Sarah Stephenson, you were seen today for evaluation of postoperative wound complications and acute pain following your recent incisional hernia repair. You reported abnormal drainage from the wound site, intermittent stabbing pain, dizziness, and mild nausea. We discussed the need for urgent surgical intervention to address the wound complications and manage your pain.  YOUR PLAN: -POSTOPERATIVE WOUND COMPLICATION FOLLOWING INCISIONAL HERNIA REPAIR: You have abnormal wound drainage and discoloration at the prior drain site, which raises concerns for a possible infection. This could lead to significant issues if not treated promptly. We recommend an urgent return to the operating room tonight for wound exploration, cleaning, and placement of a wound VAC dressing to help with drainage and healing. You will be admitted to the hospital for at least overnight observation, and specialized wound care nursing will be arranged for you at home. Please remain NPO (nothing by mouth) except for sips of water  until surgery.  -ACUTE POSTOPERATIVE PAIN: You are experiencing ongoing pain at the surgical site with intermittent stabbing sensations. You prefer to avoid certain pain medications due to concerns about nausea. We recommend trying acetaminophen  for pain control today and using hydrocodone  as needed. It is important to minimize the use of pain medications to avoid nausea.  INSTRUCTIONS: Please prepare for surgery by remaining NPO except for sips of water .  You will be admitted to the hospital for at least overnight observation for wound exploration and negative pressure dressing.  Follow the instructions provided for wound care and pain management.

## 2024-03-06 LAB — CBC
HCT: 36 % (ref 36.0–46.0)
Hemoglobin: 11.6 g/dL — ABNORMAL LOW (ref 12.0–15.0)
MCH: 30.7 pg (ref 26.0–34.0)
MCHC: 32.2 g/dL (ref 30.0–36.0)
MCV: 95.2 fL (ref 80.0–100.0)
Platelets: 292 K/uL (ref 150–400)
RBC: 3.78 MIL/uL — ABNORMAL LOW (ref 3.87–5.11)
RDW: 13.2 % (ref 11.5–15.5)
WBC: 8.9 K/uL (ref 4.0–10.5)
nRBC: 0 % (ref 0.0–0.2)

## 2024-03-06 LAB — BASIC METABOLIC PANEL WITH GFR
Anion gap: 13 (ref 5–15)
BUN: 17 mg/dL (ref 8–23)
CO2: 25 mmol/L (ref 22–32)
Calcium: 9.5 mg/dL (ref 8.9–10.3)
Chloride: 100 mmol/L (ref 98–111)
Creatinine, Ser: 0.74 mg/dL (ref 0.44–1.00)
GFR, Estimated: >60 mL/min
Glucose, Bld: 152 mg/dL — ABNORMAL HIGH (ref 70–99)
Potassium: 4.1 mmol/L (ref 3.5–5.1)
Sodium: 138 mmol/L (ref 135–145)

## 2024-03-06 MED ORDER — HYDROXYZINE HCL 10 MG PO TABS
10.0000 mg | ORAL_TABLET | Freq: Once | ORAL | Status: AC
  Administered 2024-03-06: 10 mg via ORAL
  Filled 2024-03-06: qty 1

## 2024-03-06 NOTE — Progress Notes (Signed)
 [facility]  SURGICAL PROGRESS NOTE  Hospital Day(s): 1.   Post op day(s): 1 Day Post-Op.   Interval History:  Patient seen and examined. No acute events or new complaints overnight.  Patient reports feeling much better.  Reports mild generalized tenderness.  Denies any nausea or vomiting.  No leukocytosis indicated on labs.   Vital signs in last 24 hours: [min-max] current  Temp:  [96.9 F (36.1 C)-97.9 F (36.6 C)] 97.3 F (36.3 C) (01/16 0809) Pulse Rate:  [58-77] 58 (01/16 0809) Resp:  [13-19] 16 (01/16 0809) BP: (104-145)/(50-68) 104/52 (01/16 0809) SpO2:  [90 %-100 %] 94 % (01/16 0809)             Intake/Output last 2 shifts:  01/15 0701 - 01/16 0700 In: 910 [P.O.:60; I.V.:700; IV Piggyback:150] Out: 56 [Drains:55; Blood:1]   Physical Exam:  Constitutional: alert, cooperative and no distress  Respiratory: breathing non-labored at rest  Cardiovascular: regular rate and sinus rhythm  Gastrointestinal: soft, mild generalized tenderness, and non-distended, wound VAC over open wound in place  Labs:     Latest Ref Rng & Units 03/06/2024    5:11 AM 02/28/2024    5:07 AM 02/27/2024    5:13 AM  CBC  WBC 4.0 - 10.5 K/uL 8.9  9.1  8.7   Hemoglobin 12.0 - 15.0 g/dL 88.3  87.9  87.5   Hematocrit 36.0 - 46.0 % 36.0  37.0  37.9   Platelets 150 - 400 K/uL 292  180  195       Latest Ref Rng & Units 03/06/2024    5:11 AM 02/28/2024    5:07 AM 02/27/2024    5:13 AM  CMP  Glucose 70 - 99 mg/dL 847  898  886   BUN 8 - 23 mg/dL 17  21  18    Creatinine 0.44 - 1.00 mg/dL 9.25  9.18  9.31   Sodium 135 - 145 mmol/L 138  139  137   Potassium 3.5 - 5.1 mmol/L 4.1  4.5  4.8   Chloride 98 - 111 mmol/L 100  104  103   CO2 22 - 32 mmol/L 25  26  26    Calcium 8.9 - 10.3 mg/dL 9.5  8.9  9.3     Imaging studies: No new pertinent imaging studies   Assessment/Plan:  69 y.o. female with recent incisional hernia repair with mesh with now suspected wound infection 1 Day  Post-Op s/p incision and drainage, complicated by pertinent comorbidities including psoriasis.   - Stable vital signs, no fever not tachycardic.  No leukocytosis, will continue to monitor.  Overall appeared comfortable this morning with improving pain.  Plan to change wound VAC with Dr. Rodolph today.  Continue IV Zosyn  and pain management.  -- Gilmer Cea PA-C

## 2024-03-06 NOTE — Progress Notes (Signed)
 Mobility Specialist - Progress Note   03/06/24 1113  Mobility  Activity Ambulated with assistance  Level of Assistance Contact guard assist, steadying assist  Assistive Device Front wheel walker  Activity Response Tolerated well  Mobility visit 1 Mobility  Mobility Specialist Start Time (ACUTE ONLY) 1052  Mobility Specialist Stop Time (ACUTE ONLY) 1112  Mobility Specialist Time Calculation (min) (ACUTE ONLY) 20 min   MS responding to call bell, Pt requesting to use the bathroom. She required MinA to stand from the EOB and CGA-MinG to amb to/from the bathroom-- tolerated well. Pt left seated in the recliner with needs within reach.   America Silvan Mobility Specialist 03/06/24 11:24 AM

## 2024-03-06 NOTE — TOC Initial Note (Signed)
 Transition of Care [facility]) - Initial/Assessment Note    Patient Details  Name: Sarah Stephenson MRN: [id number] Date of Birth: 09/27/1955  Transition of Care [facility]) CM/SW Contact:    Corean ONEIDA Haddock, RN Phone Number: 03/06/2024, 12:01 PM  Clinical Narrative:                  Met with patient and husband Ozell at   Bedside  Admitted for:n 1 Day Post-Op s/p incision and drainage  Admitted from:Home with husband  PCP: Sherial  Current home health/prior home health/DME: RW and shower seat  Per MD patient will require wound vac at discharge.  Patient in agreement and aware that she will require [facility] RN at discharge.  Patient states that she does not have a preference of HH agency.     Referral accepted by [facility] in [place].  Cory with [facility] notified  Wound vac referral provided to Marylen Kraft [phone] with [employer].  I have provided him with MD email address to send order for signature       Patient Goals and CMS Choice            Expected Discharge Plan and Services                                              Prior Living Arrangements/Services                       Activities of Daily Living   ADL Screening (condition at time of admission) Independently performs ADLs?: No Does the patient have a NEW difficulty with bathing/dressing/toileting/self-feeding that is expected to last >3 days?: No Does the patient have a NEW difficulty with getting in/out of bed, walking, or climbing stairs that is expected to last >3 days?: No Does the patient have a NEW difficulty with communication that is expected to last >3 days?: No Is the patient deaf or have difficulty hearing?: No Does the patient have difficulty seeing, even when wearing glasses/contacts?: No Does the patient have difficulty concentrating, remembering, or making decisions?: No  Permission Sought/Granted                  Emotional Assessment              Admission diagnosis:   Wound, open, abdominal wall, anterior [S31.109A] Patient Active Problem List   Diagnosis Date Noted   Wound, open, abdominal wall, anterior 03/05/2024   Incisional hernia 02/26/2024   COVID-19 09/19/2020   Long-term use of immunosuppressant medication 08/16/2020   Psoriasis 08/16/2020   Diverticulitis of colon 02/24/2016   Diverticulitis of intestine with abscess and bleeding 02/24/2016   Hematuria 02/24/2016   Near syncope 02/24/2016   PCP:  Sherial Bail, MD Pharmacy:   [facility] [phone] [place], [place] - [address] AT [facility] [address] [place] [address] [place] [address] Phone: [phone] Fax: [phone]  [facility] [place], [place] - [address] [place] [address] Phone: [phone] Fax: [phone]  [facility] [place], [place] - [address] [place] [address] Phone: [phone] Fax: [phone]     Social Drivers of Health (SDOH) Social History: SDOH Screenings   Food Insecurity: No Food Insecurity (03/05/2024)  Housing: Low Risk (03/05/2024)  Transportation Needs: No Transportation Needs (03/05/2024)  Utilities: Not At Risk (03/05/2024)  Financial Resource Strain: Low Risk  (01/08/2024)   Received from [facility]  Social Connections: Unknown (03/06/2024)  Tobacco Use: High Risk (03/05/2024)   SDOH Interventions:     Readmission Risk Interventions     No data to display

## 2024-03-06 NOTE — TOC CM/SW Note (Signed)
 Transition of Care [facility]) - Inpatient Brief Assessment   Patient Details  Name: Sarah Stephenson MRN: [id number] Date of Birth: 1956-01-18  Transition of Care [facility]) CM/SW Contact:    Corean ONEIDA Haddock, RN Phone Number: 03/06/2024, 9:26 AM   Clinical Narrative:  Transition of Care Department [facility]) has reviewed patient and no TOC needs have been identified at this time.  If new patient transition needs arise, please place a TOC consult.   Transition of Care Asessment: Insurance and Status: Insurance coverage has been reviewed Patient has primary care physician: Yes     Prior/Current Home Services: No current home services Social Drivers of Health Review: SDOH reviewed no interventions necessary   Transition of care needs: no transition of care needs at this time

## 2024-03-07 ENCOUNTER — Encounter: Payer: Self-pay | Admitting: General Surgery

## 2024-03-07 LAB — BASIC METABOLIC PANEL WITH GFR
Anion gap: 9 (ref 5–15)
BUN: 17 mg/dL (ref 8–23)
CO2: 27 mmol/L (ref 22–32)
Calcium: 9.2 mg/dL (ref 8.9–10.3)
Chloride: 103 mmol/L (ref 98–111)
Creatinine, Ser: 0.72 mg/dL (ref 0.44–1.00)
GFR, Estimated: >60 mL/min
Glucose, Bld: 96 mg/dL (ref 70–99)
Potassium: 3.9 mmol/L (ref 3.5–5.1)
Sodium: 140 mmol/L (ref 135–145)

## 2024-03-07 LAB — CBC
HCT: 33.1 % — ABNORMAL LOW (ref 36.0–46.0)
Hemoglobin: 10.7 g/dL — ABNORMAL LOW (ref 12.0–15.0)
MCH: 31 pg (ref 26.0–34.0)
MCHC: 32.3 g/dL (ref 30.0–36.0)
MCV: 95.9 fL (ref 80.0–100.0)
Platelets: 319 K/uL (ref 150–400)
RBC: 3.45 MIL/uL — ABNORMAL LOW (ref 3.87–5.11)
RDW: 13.3 % (ref 11.5–15.5)
WBC: 9.3 K/uL (ref 4.0–10.5)
nRBC: 0 % (ref 0.0–0.2)

## 2024-03-07 NOTE — Progress Notes (Signed)
 Patient ID: Sarah Stephenson, female   DOB: Sep 23, 1955, 69 y.o.   MRN: [id number]     SURGICAL PROGRESS NOTE   Hospital Day(s): 2.   Interval History: Patient seen and examined, no acute events or new complaints overnight. Patient reports feeling okay.  She denies any major setback.  She is having some pain on bilateral flanks but tolerable.  Denies any fever.  Denies any nausea or vomiting.  Continue passing gas.  Vital signs in last 24 hours: [min-max] current  Temp:  [97.3 F (36.3 C)-98.4 F (36.9 C)] 98.4 F (36.9 C) (01/17 0435) Pulse Rate:  [58-65] 62 (01/17 0435) Resp:  [16-18] 16 (01/17 0435) BP: (104-105)/(52-61) 105/58 (01/17 0435) SpO2:  [94 %-97 %] 95 % (01/17 0435) Weight:  [95.7 kg] 95.7 kg (01/16 2108)     Height: 5' 6.5 (168.9 cm) Weight: 95.7 kg BMI (Calculated): 33.55   Physical Exam:  Constitutional: alert, cooperative and no distress  Respiratory: breathing non-labored at rest  Cardiovascular: regular rate and sinus rhythm  Gastrointestinal: soft, non-tender, and non-distended.  Negative pressure dressing in place  Labs:     Latest Ref Rng & Units 03/07/2024    4:55 AM 03/06/2024    5:11 AM 02/28/2024    5:07 AM  CBC  WBC 4.0 - 10.5 K/uL 9.3  8.9  9.1   Hemoglobin 12.0 - 15.0 g/dL 89.2  88.3  87.9   Hematocrit 36.0 - 46.0 % 33.1  36.0  37.0   Platelets 150 - 400 K/uL 319  292  180       Latest Ref Rng & Units 03/07/2024    4:55 AM 03/06/2024    5:11 AM 02/28/2024    5:07 AM  CMP  Glucose 70 - 99 mg/dL 96  847  898   BUN 8 - 23 mg/dL 17  17  21    Creatinine 0.44 - 1.00 mg/dL 9.27  9.25  9.18   Sodium 135 - 145 mmol/L 140  138  139   Potassium 3.5 - 5.1 mmol/L 3.9  4.1  4.5   Chloride 98 - 111 mmol/L 103  100  104   CO2 22 - 32 mmol/L 27  25  26    Calcium 8.9 - 10.3 mg/dL 9.2  9.5  8.9     Imaging studies: No new pertinent imaging studies  Assessment/Plan:  Open wound of abdominal wall -Status post irrigation and negative pressure dressing placement -  Continue negative pressure wound therapy - Continue antibiotic therapy - Encouraged the patient to ambulate  Lucas Petrin, MD

## 2024-03-08 MED ORDER — ACETAMINOPHEN 325 MG PO TABS
650.0000 mg | ORAL_TABLET | Freq: Four times a day (QID) | ORAL | Status: DC | PRN
  Administered 2024-03-08 – 2024-03-13 (×5): 650 mg via ORAL
  Filled 2024-03-08 (×5): qty 2

## 2024-03-08 NOTE — Plan of Care (Signed)
  Problem: Pain Managment: Goal: General experience of comfort will improve and/or be controlled Outcome: Progressing   Problem: Safety: Goal: Ability to remain free from injury will improve Outcome: Progressing   Problem: Skin Integrity: Goal: Risk for impaired skin integrity will decrease Outcome: Progressing

## 2024-03-08 NOTE — Progress Notes (Signed)
 Patient ID: Sarah Stephenson, female   DOB: 1955/03/06, 69 y.o.   MRN: [id number]     SURGICAL PROGRESS NOTE   Hospital Day(s): 3.   Interval History: Patient seen and examined, no acute events or new complaints overnight. Patient reports feeling well.  She has some pressure and abdominal wall but denies any significant pain.  She denies any nausea or vomiting.  She is having bowel movement.  Vital signs in last 24 hours: [min-max] current  Temp:  [97.6 F (36.4 C)-98.6 F (37 C)] 97.6 F (36.4 C) (01/18 0806) Pulse Rate:  [59-63] 59 (01/18 0806) Resp:  [16-17] 16 (01/18 0806) BP: (113-128)/(55-67) 113/56 (01/18 0806) SpO2:  [96 %-98 %] 96 % (01/18 0806)     Height: 5' 6.5 (168.9 cm) Weight: 95.7 kg BMI (Calculated): 33.55   Physical Exam:  Constitutional: alert, cooperative and no distress  Respiratory: breathing non-labored at rest  Cardiovascular: regular rate and sinus rhythm  Gastrointestinal: soft, non-tender, and non-distended.  Midline wound without any necrotic tissue.  Starting to form small amount of granulation tissue.  Right lower quadrant wound still with turbid fluid drainage.  No erythema  Labs:     Latest Ref Rng & Units 03/07/2024    4:55 AM 03/06/2024    5:11 AM 02/28/2024    5:07 AM  CBC  WBC 4.0 - 10.5 K/uL 9.3  8.9  9.1   Hemoglobin 12.0 - 15.0 g/dL 89.2  88.3  87.9   Hematocrit 36.0 - 46.0 % 33.1  36.0  37.0   Platelets 150 - 400 K/uL 319  292  180       Latest Ref Rng & Units 03/07/2024    4:55 AM 03/06/2024    5:11 AM 02/28/2024    5:07 AM  CMP  Glucose 70 - 99 mg/dL 96  847  898   BUN 8 - 23 mg/dL 17  17  21    Creatinine 0.44 - 1.00 mg/dL 9.27  9.25  9.18   Sodium 135 - 145 mmol/L 140  138  139   Potassium 3.5 - 5.1 mmol/L 3.9  4.1  4.5   Chloride 98 - 111 mmol/L 103  100  104   CO2 22 - 32 mmol/L 27  25  26    Calcium 8.9 - 10.3 mg/dL 9.2  9.5  8.9     Imaging studies: No new pertinent imaging studies   Assessment/Plan:  Open wound of abdominal  wall -Status post irrigation and negative pressure dressing placement postop day #3 - Continue negative pressure wound therapy.  I personally changed the negative pressure dressing (DME) today.  Previous negative pressure dressing removed.  The wound measured same as before 16 cm x 11 cm (area of 176 cm).  A new GranuFoam silver dressing was placed.  An adequate negative pressure dressing was achieved.  No complications during exchange. - Continue antibiotic therapy -If the turbid fluid from right lower quadrant incision does not improve for tomorrow we will consider taking her back to the pressure room for further irrigation and exploration of right lower quadrant wound. - Encouraged the patient to ambulate  Lucas Petrin, MD

## 2024-03-09 ENCOUNTER — Encounter: Payer: Self-pay | Admitting: General Surgery

## 2024-03-09 ENCOUNTER — Inpatient Hospital Stay: Admitting: Anesthesiology

## 2024-03-09 ENCOUNTER — Encounter
Admission: AD | Disposition: A | Discharge status: Home / Self Care | Payer: Self-pay | Source: Ambulatory Visit | Attending: General Surgery

## 2024-03-09 HISTORY — PX: VACUUM ASSISTED CLOSURE CHANGE: SHX5227

## 2024-03-09 HISTORY — PX: INCISION AND DRAINAGE OF WOUND: SHX1803

## 2024-03-09 MED ORDER — LACTATED RINGERS IV SOLN: INTRAVENOUS | Status: DC | PRN

## 2024-03-09 MED ORDER — MIDAZOLAM HCL 2 MG/2ML IJ SOLN
INTRAMUSCULAR | Status: AC
  Filled 2024-03-09: qty 2

## 2024-03-09 MED ORDER — ONDANSETRON HCL 4 MG/2ML IJ SOLN: 4.0000 mg | Freq: Once | INTRAMUSCULAR | Status: DC | PRN

## 2024-03-09 MED ORDER — EPHEDRINE SULFATE (PRESSORS) 25 MG/5ML IV SOSY
PREFILLED_SYRINGE | INTRAVENOUS | Status: DC | PRN
  Administered 2024-03-09: 10 mg via INTRAVENOUS

## 2024-03-09 MED ORDER — OXYCODONE HCL 5 MG PO TABS: 5.0000 mg | ORAL_TABLET | Freq: Once | ORAL | Status: DC | PRN

## 2024-03-09 MED ORDER — FENTANYL CITRATE (PF) 100 MCG/2ML IJ SOLN
INTRAMUSCULAR | Status: DC | PRN
  Administered 2024-03-09: 25 ug via INTRAVENOUS

## 2024-03-09 MED ORDER — 0.9 % SODIUM CHLORIDE (POUR BTL) OPTIME
TOPICAL | Status: DC | PRN
  Administered 2024-03-09: 1500 mL

## 2024-03-09 MED ORDER — OXYCODONE HCL 5 MG/5ML PO SOLN: 5.0000 mg | Freq: Once | ORAL | Status: DC | PRN

## 2024-03-09 MED ORDER — PIPERACILLIN-TAZOBACTAM 3.375 G IVPB
INTRAVENOUS | Status: AC
  Filled 2024-03-09: qty 50

## 2024-03-09 MED ORDER — LIDOCAINE HCL (CARDIAC) PF 100 MG/5ML IV SOSY
PREFILLED_SYRINGE | INTRAVENOUS | Status: DC | PRN
  Administered 2024-03-09: 100 mg via INTRAVENOUS

## 2024-03-09 MED ORDER — ACETAMINOPHEN 10 MG/ML IV SOLN
INTRAVENOUS | Status: AC
  Filled 2024-03-09: qty 100

## 2024-03-09 MED ORDER — PROPOFOL 10 MG/ML IV BOLUS
INTRAVENOUS | Status: AC
  Filled 2024-03-09: qty 20

## 2024-03-09 MED ORDER — MIDAZOLAM HCL (PF) 2 MG/2ML IJ SOLN
INTRAMUSCULAR | Status: DC | PRN
  Administered 2024-03-09: 2 mg via INTRAVENOUS

## 2024-03-09 MED ORDER — FENTANYL CITRATE (PF) 100 MCG/2ML IJ SOLN
INTRAMUSCULAR | Status: AC
  Filled 2024-03-09: qty 2

## 2024-03-09 MED ORDER — ACETAMINOPHEN 10 MG/ML IV SOLN
INTRAVENOUS | Status: DC | PRN
  Administered 2024-03-09: 1000 mg via INTRAVENOUS

## 2024-03-09 MED ORDER — PROPOFOL 10 MG/ML IV BOLUS
INTRAVENOUS | Status: DC | PRN
  Administered 2024-03-09: 150 mg via INTRAVENOUS

## 2024-03-09 MED ORDER — FENTANYL CITRATE (PF) 100 MCG/2ML IJ SOLN: 25.0000 ug | INTRAMUSCULAR | Status: DC | PRN

## 2024-03-09 NOTE — Plan of Care (Signed)
  Problem: Pain Managment: Goal: General experience of comfort will improve and/or be controlled Outcome: Progressing   Problem: Safety: Goal: Ability to remain free from injury will improve Outcome: Progressing   Problem: Skin Integrity: Goal: Risk for impaired skin integrity will decrease Outcome: Progressing

## 2024-03-09 NOTE — Progress Notes (Signed)
 Patient ID: Lawren Sexson, female   DOB: 01/13/1956, 69 y.o.   MRN: [id number]     SURGICAL PROGRESS NOTE   Hospital Day(s): 4.   Interval History: Patient seen and examined, no acute events or new complaints overnight.   Vital signs in last 24 hours: [min-max] current  Temp:  [97.6 F (36.4 C)-98 F (36.7 C)] 98 F (36.7 C) (01/19 0332) Pulse Rate:  [59-70] 70 (01/19 0332) Resp:  [16-20] 16 (01/19 0332) BP: (112-141)/(51-71) 124/60 (01/19 0332) SpO2:  [96 %-98 %] 96 % (01/19 0332)     Height: 5' 6.5 (168.9 cm) Weight: 95.7 kg BMI (Calculated): 33.55   Physical Exam:  Constitutional: alert, cooperative and no distress  Respiratory: breathing non-labored at rest  Cardiovascular: regular rate and sinus rhythm  Gastrointestinal: soft, non-tender, and non-distended.  Negative pressure dressing in place with turbid fluid  Labs:     Latest Ref Rng & Units 03/07/2024    4:55 AM 03/06/2024    5:11 AM 02/28/2024    5:07 AM  CBC  WBC 4.0 - 10.5 K/uL 9.3  8.9  9.1   Hemoglobin 12.0 - 15.0 g/dL 89.2  88.3  87.9   Hematocrit 36.0 - 46.0 % 33.1  36.0  37.0   Platelets 150 - 400 K/uL 319  292  180       Latest Ref Rng & Units 03/07/2024    4:55 AM 03/06/2024    5:11 AM 02/28/2024    5:07 AM  CMP  Glucose 70 - 99 mg/dL 96  847  898   BUN 8 - 23 mg/dL 17  17  21    Creatinine 0.44 - 1.00 mg/dL 9.27  9.25  9.18   Sodium 135 - 145 mmol/L 140  138  139   Potassium 3.5 - 5.1 mmol/L 3.9  4.1  4.5   Chloride 98 - 111 mmol/L 103  100  104   CO2 22 - 32 mmol/L 27  25  26    Calcium 8.9 - 10.3 mg/dL 9.2  9.5  8.9     Imaging studies: No new pertinent imaging studies   Assessment/Plan:  Open wound of abdominal wall -Status post irrigation and negative pressure dressing placement postop day #4 - Persistent, none clear turbid fluid draining from the negative pressure dressing.  Will take her back to the OR for exploration of the wound, irrigation and possible debridement.  Patient oriented about  plan and agreed to proceed with irrigation and debridement of the abdominal wall wound. -Continue IV antibiotic therapy - Encouraged the patient to ambulate  Lucas Petrin, MD

## 2024-03-09 NOTE — Anesthesia Procedure Notes (Signed)
 Procedure Name: LMA Insertion Date/Time: 03/09/2024 2:07 PM  Performed by: Veronica Alm BROCKS, CRNAPre-anesthesia Checklist: Patient identified, Emergency Drugs available, Suction available and Patient being monitored Patient Re-evaluated:Patient Re-evaluated prior to induction Oxygen Delivery Method: Circle system utilized Preoxygenation: Pre-oxygenation with 100% oxygen Induction Type: IV induction Ventilation: Mask ventilation without difficulty LMA Size: 4.0 Number of attempts: 1 Placement Confirmation: positive ETCO2, CO2 detector and breath sounds checked- equal and bilateral Comments: Inserted by Darrow SAILOR.

## 2024-03-09 NOTE — Transfer of Care (Signed)
 Immediate Anesthesia Transfer of Care Note  Patient: Sarah Stephenson  Procedure(s) Performed: IRRIGATION AND DEBRIDEMENT WOUND REPLACEMENT, WOUND VAC DRESSING, ABDOMEN  Patient Location: PACU  Anesthesia Type:General  Level of Consciousness: awake, alert , and drowsy  Airway & Oxygen Therapy: Patient Spontanous Breathing and Patient connected to face mask oxygen  Post-op Assessment: Report given to RN and Post -op Vital signs reviewed and stable  Post vital signs: Reviewed and stable  Last Vitals:  Vitals Value Taken Time  BP 127/53 03/09/24 14:53  Temp    Pulse 67 03/09/24 14:53  Resp 24 03/09/24 14:53  SpO2 100 % 03/09/24 14:53    Last Pain:  Vitals:   03/09/24 1245  TempSrc: Temporal  PainSc: 0-No pain         Complications: No notable events documented.

## 2024-03-09 NOTE — TOC Progression Note (Signed)
 Transition of Care [facility]) - Progression Note    Patient Details  Name: Sarah Stephenson MRN: [id number] Date of Birth: [birth year]  [facility]) CM/SW Contact  Nathanael CHRISTELLA Ring, RN Phone Number: 03/09/2024, 12:58 PM  Clinical Narrative:     Per surgery note patient will go back to the OR today for I & D of wound bed.  Called and spoke with Marylen with [employer], he says that the wound vac should have been delivered this morning.  Confirmed with the nurse that the wound vac did get delivered to the room this morning.                      Expected Discharge Plan and Services                                               Social Drivers of Health (SDOH) Interventions SDOH Screenings   Food Insecurity: No Food Insecurity (03/05/2024)  Housing: Low Risk (03/05/2024)  Transportation Needs: No Transportation Needs (03/05/2024)  Utilities: Not At Risk (03/05/2024)  Financial Resource Strain: Low Risk  (01/08/2024)   Received from [facility]  Social Connections: Unknown (03/06/2024)  Tobacco Use: High Risk (03/09/2024)    Readmission Risk Interventions     No data to display

## 2024-03-09 NOTE — Op Note (Signed)
 Preoperative diagnosis: Wound dehiscence of abdominal wall  Postoperative diagnosis: Wound dehiscence of abdominal wall  Procedure: Irrigation of abdominal wall wound.   Surgeon: Dr. Rodolph, MD  Wound Classification: Contaminated  Indications: Patient is a 69 y.o. female  presented with persistent drainage from the abdominal wall.  Irrigation and possible debridement of the wound was indicated to rule out deep abscess  Findings:  1.  No abscess, source of fistula or infection was identified 2.  No necrotic tissue was identified  Description of procedure: The patient was placed in the supine position and anesthesia was induced. A timeout was completed verifying correct patient, procedure, site, positioning, and implant(s) and/or special equipment prior to beginning this procedure.  Previous negative pressure dressing was removed.  The abdomen was prepped and draped in the sterile fashion with Betadine. Right lower quadrant wound was extended and undermining of the subcutaneous tissue was performed looking for deeper abscess or source of drainage or infection.  No deep abscess, source of infection, no fistula was identified.  Midline still without any source of infection or deeper drainage. The wound was irrigated with 3 L of warm saline.  A liter of Irrisept was also used for irrigation.  Now the wounds measure 16 cm x 15 cm (240 cm).  A new negative pressure dressing (DME) Granufoam Silver wound VAC was placed in the midline and right lower quadrant wound.  Tape was placed on top and connected with a bridge.  Adequate negative pressure dressing was achieved. The patient tolerated the procedure well and was taken to the postanesthesia care unit in satisfactory condition.   Specimen: None  Complications: None  Estimated Blood Loss: Minimal

## 2024-03-09 NOTE — Anesthesia Preprocedure Evaluation (Addendum)
 "                                  Anesthesia Evaluation  Patient identified by MRN, date of birth, ID band Patient awake    Reviewed: Allergy & Precautions, NPO status , Patient's Chart, lab work & pertinent test results  Airway Mallampati: III  TM Distance: >3 FB Neck ROM: Full    Dental  (+) Teeth Intact   Pulmonary sleep apnea and Continuous Positive Airway Pressure Ventilation , Current Smoker and Patient abstained from smoking.   Pulmonary exam normal  + decreased breath sounds      Cardiovascular Exercise Tolerance: Good hypertension, Pt. on medications Normal cardiovascular exam Rhythm:Regular Rate:Normal     Neuro/Psych  Headaches   Depression    negative neurological ROS  negative psych ROS   GI/Hepatic negative GI ROS, Neg liver ROS,GERD  Medicated,,  Endo/Other  negative endocrine ROS  Class 3 obesity  Renal/GU negative Renal ROS  negative genitourinary   Musculoskeletal   Abdominal  (+) + obese  Peds negative pediatric ROS (+)  Hematology negative hematology ROS (+)   Anesthesia Other Findings Past Medical History: No date: Allergy No date: Anemia No date: Depression No date: Diverticulitis No date: Dyspnea No date: GERD (gastroesophageal reflux disease) No date: Headache No date: Hypertension No date: Pneumonia No date: Psoriasis No date: Psoriatic arthritis (HCC) No date: Sleep apnea No date: Thyroid  disease  Past Surgical History: No date: BUNIONECTOMY No date: COLON SURGERY     Comment:  partial colostomy creation due to diverticulitis in 2018 No date: COLOSTOMY TAKEDOWN No date: EYE SURGERY 02/26/2024: INSERTION OF MESH; N/A     Comment:  Procedure: INSERTION OF MESH;  Surgeon: Rodolph Romano, MD;  Location: [facility] ORS;  Service: General;                Laterality: N/A; 03/05/2024: IRRIGATION AND DEBRIDEMENT ABSCESS; N/A     Comment:  Procedure: IRRIGATION AND DEBRIDEMENT ABSCESS;  Surgeon:               Rodolph Romano, MD;  Location: [facility] ORS;  Service:              General;  Laterality: N/A; No date: TONSILLECTOMY 02/26/2024: VENTRAL HERNIA REPAIR; N/A     Comment:  Procedure: REPAIR, HERNIA, VENTRAL;  Surgeon:               Rodolph Romano, MD;  Location: [facility] ORS;  Service:              General;  Laterality: N/A; No date: WISDOM TOOTH EXTRACTION  BMI    Body Mass Index: 33.54 kg/m      Reproductive/Obstetrics negative OB ROS                              Anesthesia Physical Anesthesia Plan  ASA: 3  Anesthesia Plan: General   Post-op Pain Management:    Induction: Intravenous  PONV Risk Score and Plan: Dexamethasone , Ondansetron , Midazolam  and Treatment may vary due to age or medical condition  Airway Management Planned: LMA  Additional Equipment:   Intra-op Plan:   Post-operative Plan: Extubation in OR  Informed Consent: I have reviewed the patients History and Physical, chart, labs and discussed the procedure including the risks,  benefits and alternatives for the proposed anesthesia with the patient or authorized representative who has indicated his/her understanding and acceptance.     Dental Advisory Given  Plan Discussed with: CRNA  Anesthesia Plan Comments:          Anesthesia Quick Evaluation  "

## 2024-03-10 ENCOUNTER — Encounter: Payer: Self-pay | Admitting: General Surgery

## 2024-03-10 NOTE — Plan of Care (Signed)
" °  Problem: Education: Goal: Knowledge of the prescribed therapeutic regimen will improve Outcome: Progressing   Problem: Bowel/Gastric: Goal: Gastrointestinal status for postoperative course will improve Outcome: Progressing   Problem: Cardiac: Goal: Ability to maintain an adequate cardiac output Outcome: Progressing Goal: Will show no evidence of cardiac arrhythmias Outcome: Progressing   Problem: Nutritional: Goal: Will attain and maintain optimal nutritional status Outcome: Progressing   "

## 2024-03-10 NOTE — Progress Notes (Signed)
 Patient ID: Sarah Stephenson, female   DOB: 1955/10/27, 69 y.o.   MRN: [id number]     SURGICAL PROGRESS NOTE   Hospital Day(s): 5.   Interval History: Patient seen and examined, no acute events or new complaints overnight. Patient reports feeling okay.  She denies any abdominal pain.  She denies any issues with the negative pressure dressing.  No pain radiation.  No alleviating or aggravating factors.  Vital signs in last 24 hours: [min-max] current  Temp:  [97.4 F (36.3 C)-98.2 F (36.8 C)] 98 F (36.7 C) (01/20 0727) Pulse Rate:  [59-67] 61 (01/20 0727) Resp:  [12-24] 16 (01/20 0727) BP: (107-137)/(53-66) 137/66 (01/20 0727) SpO2:  [92 %-100 %] 96 % (01/20 0727)     Height: 5' 6.5 (168.9 cm) Weight: 95.7 kg BMI (Calculated): 33.55   Physical Exam:  Constitutional: alert, cooperative and no distress  Respiratory: breathing non-labored at rest  Cardiovascular: regular rate and sinus rhythm  Gastrointestinal: soft, non-tender, and non-distended.  Negative pressure dressing in place.  Serosanguineous output today.  Labs:     Latest Ref Rng & Units 03/07/2024    4:55 AM 03/06/2024    5:11 AM 02/28/2024    5:07 AM  CBC  WBC 4.0 - 10.5 K/uL 9.3  8.9  9.1   Hemoglobin 12.0 - 15.0 g/dL 89.2  88.3  87.9   Hematocrit 36.0 - 46.0 % 33.1  36.0  37.0   Platelets 150 - 400 K/uL 319  292  180       Latest Ref Rng & Units 03/07/2024    4:55 AM 03/06/2024    5:11 AM 02/28/2024    5:07 AM  CMP  Glucose 70 - 99 mg/dL 96  847  898   BUN 8 - 23 mg/dL 17  17  21    Creatinine 0.44 - 1.00 mg/dL 9.27  9.25  9.18   Sodium 135 - 145 mmol/L 140  138  139   Potassium 3.5 - 5.1 mmol/L 3.9  4.1  4.5   Chloride 98 - 111 mmol/L 103  100  104   CO2 22 - 32 mmol/L 27  25  26    Calcium 8.9 - 10.3 mg/dL 9.2  9.5  8.9     Imaging studies: No new pertinent imaging studies   Assessment/Plan:  Open wound of abdominal wall -Status post irrigation and negative pressure dressing placement postop day #5 -Return  to the OR yesterday for wound collection without any sign of deeper infection. -Today the output is more serosanguineous -Continue negative pressure dressing -Continue IV antibiotic therapy - Encouraged the patient to ambulate    Lucas Petrin, MD

## 2024-03-10 NOTE — Plan of Care (Signed)
" °  Problem: Education: Goal: Knowledge of the prescribed therapeutic regimen will improve Outcome: Progressing   Problem: Clinical Measurements: Goal: Ability to maintain clinical measurements within normal limits Outcome: Progressing Goal: Postoperative complications will be avoided or minimized Outcome: Progressing   Problem: Skin Integrity: Goal: Demonstrates signs of wound healing without infection Outcome: Progressing   Problem: Education: Goal: Knowledge of General Education information will improve Description: Including pain rating scale, medication(s)/side effects and non-pharmacologic comfort measures Outcome: Progressing   Problem: Clinical Measurements: Goal: Ability to maintain clinical measurements within normal limits will improve Outcome: Progressing Goal: Will remain free from infection Outcome: Progressing Goal: Diagnostic test results will improve Outcome: Progressing Goal: Respiratory complications will improve Outcome: Progressing Goal: Cardiovascular complication will be avoided Outcome: Progressing   Problem: Activity: Goal: Risk for activity intolerance will decrease Outcome: Progressing   Problem: Nutrition: Goal: Adequate nutrition will be maintained Outcome: Progressing   "

## 2024-03-11 MED ORDER — PIPERACILLIN-TAZOBACTAM 3.375 G IVPB
3.3750 g | Freq: Three times a day (TID) | INTRAVENOUS | Status: DC
  Administered 2024-03-11 – 2024-03-13 (×7): 3.375 g via INTRAVENOUS
  Filled 2024-03-11 (×7): qty 50

## 2024-03-11 NOTE — Plan of Care (Signed)
" °  Problem: Education: Goal: Knowledge of the prescribed therapeutic regimen will improve Outcome: Progressing   Problem: Bowel/Gastric: Goal: Gastrointestinal status for postoperative course will improve Outcome: Progressing   Problem: Nutritional: Goal: Will attain and maintain optimal nutritional status Outcome: Progressing   Problem: Clinical Measurements: Goal: Ability to maintain clinical measurements within normal limits Outcome: Progressing Goal: Postoperative complications will be avoided or minimized Outcome: Progressing   Problem: Skin Integrity: Goal: Demonstrates signs of wound healing without infection Outcome: Progressing   "

## 2024-03-11 NOTE — Anesthesia Postprocedure Evaluation (Signed)
"   Anesthesia Post Note  Patient: Sarah Stephenson  Procedure(s) Performed: IRRIGATION AND DEBRIDEMENT WOUND REPLACEMENT, WOUND VAC DRESSING, ABDOMEN  Patient location during evaluation: PACU Anesthesia Type: General Level of consciousness: awake Pain management: satisfactory to patient Vital Signs Assessment: post-procedure vital signs reviewed and stable Respiratory status: spontaneous breathing Cardiovascular status: stable Anesthetic complications: no   No notable events documented.   Last Vitals:  Vitals:   03/10/24 2127 03/11/24 0428  BP: 120/61 (!) 117/58  Pulse: 63 65  Resp: 16 17  Temp: 36.6 C 36.7 C  SpO2: 98% 96%    Last Pain:  Vitals:   03/11/24 0428  TempSrc: Oral  PainSc:                  VAN STAVEREN,Karess Harner      "

## 2024-03-11 NOTE — Progress Notes (Signed)
 Patient ID: Sarah Stephenson, female   DOB: 08-10-1955, 69 y.o.   MRN: [id number]     SURGICAL PROGRESS NOTE   Hospital Day(s): 6.   Interval History: Patient seen and examined, no acute events or new complaints overnight. Patient reports feeling well.  She has some soreness on the right side of the abdomen.  No severe pain.  No pain radiation.  Pain aggravated by dressing changes.  No alleviating factors.  Patient endorses that the nausea and vomiting is also getting better as well.  Denies any fever.  Vital signs in last 24 hours: [min-max] current  Temp:  [97.8 F (36.6 C)-98.2 F (36.8 C)] 97.9 F (36.6 C) (01/21 0748) Pulse Rate:  [63-72] 68 (01/21 0748) Resp:  [15-17] 16 (01/21 0748) BP: (116-121)/(52-64) 116/64 (01/21 0748) SpO2:  [96 %-98 %] 97 % (01/21 0748)     Height: 5' 6.5 (168.9 cm) Weight: 95.7 kg BMI (Calculated): 33.55   Physical Exam:  Constitutional: alert, cooperative and no distress  Respiratory: breathing non-labored at rest  Cardiovascular: regular rate and sinus rhythm  Gastrointestinal: soft, non-tender, and non-distended.  No purulence or necrotic tissue.    Labs:     Latest Ref Rng & Units 03/07/2024    4:55 AM 03/06/2024    5:11 AM 02/28/2024    5:07 AM  CBC  WBC 4.0 - 10.5 K/uL 9.3  8.9  9.1   Hemoglobin 12.0 - 15.0 g/dL 89.2  88.3  87.9   Hematocrit 36.0 - 46.0 % 33.1  36.0  37.0   Platelets 150 - 400 K/uL 319  292  180       Latest Ref Rng & Units 03/07/2024    4:55 AM 03/06/2024    5:11 AM 02/28/2024    5:07 AM  CMP  Glucose 70 - 99 mg/dL 96  847  898   BUN 8 - 23 mg/dL 17  17  21    Creatinine 0.44 - 1.00 mg/dL 9.27  9.25  9.18   Sodium 135 - 145 mmol/L 140  138  139   Potassium 3.5 - 5.1 mmol/L 3.9  4.1  4.5   Chloride 98 - 111 mmol/L 103  100  104   CO2 22 - 32 mmol/L 27  25  26    Calcium 8.9 - 10.3 mg/dL 9.2  9.5  8.9     Assessment/Plan:  Open wound of abdominal wall -Status post irrigation and negative pressure dressing placement  postop day #6 - Improving drainage now serosanguineous.  No purulence.  - Slowly starting to form correlation tissue - I personally exchanged the negative pressure dressing today.  The previous negative pressure dressing was removed.  Wound as pictured above.  The coverage area of the negative pressure dressing still 16 cm x 15 cm (240 cm).  A new negative pressure dressing (DME) was placed in status.  Adequate negative pressure dressing achieved. - Continue IV antibiotic therapy - Next negative pressure dressing change on Friday.  Lucas Petrin, MD

## 2024-03-11 NOTE — Plan of Care (Signed)
" °  Problem: Education: Goal: Knowledge of the prescribed therapeutic regimen will improve Outcome: Progressing   Problem: Bowel/Gastric: Goal: Gastrointestinal status for postoperative course will improve Outcome: Progressing   Problem: Cardiac: Goal: Ability to maintain an adequate cardiac output Outcome: Progressing Goal: Will show no evidence of cardiac arrhythmias Outcome: Progressing   Problem: Nutritional: Goal: Will attain and maintain optimal nutritional status Outcome: Progressing   Problem: Neurological: Goal: Will regain or maintain usual level of consciousness Outcome: Progressing   "

## 2024-03-12 MED ORDER — FLUCONAZOLE 50 MG PO TABS
150.0000 mg | ORAL_TABLET | Freq: Once | ORAL | Status: AC
  Administered 2024-03-12: 150 mg via ORAL
  Filled 2024-03-12: qty 1

## 2024-03-12 NOTE — Plan of Care (Signed)
" °  Problem: Education: Goal: Knowledge of the prescribed therapeutic regimen will improve Outcome: Progressing   Problem: Clinical Measurements: Goal: Ability to maintain clinical measurements within normal limits Outcome: Progressing Goal: Postoperative complications will be avoided or minimized Outcome: Progressing   Problem: Skin Integrity: Goal: Demonstrates signs of wound healing without infection Outcome: Progressing   Problem: Activity: Goal: Risk for activity intolerance will decrease Outcome: Progressing   Problem: Nutrition: Goal: Adequate nutrition will be maintained Outcome: Progressing   Problem: Pain Managment: Goal: General experience of comfort will improve and/or be controlled Outcome: Progressing   Problem: Safety: Goal: Ability to remain free from injury will improve Outcome: Progressing   "

## 2024-03-12 NOTE — Progress Notes (Signed)
 SPIRITUAL CARE AND COUNSELING CONSULT NOTE   VISIT SUMMARY Chaplain provided spiritual/emotional support to Zamyah and family while rounding on unit. Chaplain consulted with nurse team.   SPIRITUAL ENCOUNTER                                                                                                                                                                      Type of Visit: Initial Care provided to:: Patient, Pt and family Referral source: Chaplain assessment Reason for visit: Routine spiritual support OnCall Visit: No   SPIRITUAL FRAMEWORK  Presenting Themes: Community and relationships, Goals in life/care, Values and beliefs Community/Connection: Family, Friend(s) Patient Stress Factors: Health changes Family Stress Factors: Health changes   GOALS   Self/Personal Goals: healing Clinical Care Goals: healing   INTERVENTIONS   Spiritual Care Interventions Made: Compassionate presence, Meaning making, Narrative/life review, Established relationship of care and support    INTERVENTION OUTCOMES   Outcomes: Awareness of support, Autonomy/agency, Connection to spiritual care  Chaplain provided compassionate presence, open ended questions, and reflective listening to elicit Corissa feelings about health status, treatment plan, family support, [employer] faith background, and desired outcomes.   SPIRITUAL CARE PLAN   Spiritual Care Issues Still Outstanding: Chaplain will continue to follow    If immediate needs arise, please contact [facility] 24 hour on call [phone]   Barabara Chess, Chaplain  03/12/2024 10:55 AM

## 2024-03-12 NOTE — Progress Notes (Addendum)
 [facility]- General Surgery  SURGICAL PROGRESS NOTE  Hospital Day(s): 7.   Post op day(s): 3 Days Post-Op.   Interval History:  Patient seen and examined. No acute events or new complaints overnight.  Denies any significant pain.  Admits having a burning sensation since changing negative pressure dressing yesterday on smaller wound located on right abdomen. Otherwise denies any issues.  Denies any fevers, chills, nausea, vomiting.  Vital signs in last 24 hours: [min-max] current  Temp:  [98 F (36.7 C)-98.2 F (36.8 C)] 98 F (36.7 C) (01/22 0823) Pulse Rate:  [61-68] 61 (01/22 0823) Resp:  [16-18] 16 (01/22 0823) BP: (110-139)/(55-63) 139/61 (01/22 0823) SpO2:  [97 %-100 %] 100 % (01/22 0823)     Height: 5' 6.5 (168.9 cm) Weight: 95.7 kg BMI (Calculated): 33.55   Intake/Output last 2 shifts:  01/21 0701 - 01/22 0700 In: 861.6 [P.O.:720; IV Piggyback:141.6] Out: 60 [Drains:60]   Physical Exam:  Constitutional: alert, cooperative and no distress  Respiratory: breathing non-labored at rest  Cardiovascular: regular rate and sinus rhythm  Gastrointestinal: soft, non-tender, and non-distended, hardening of the skin on the left side of abdomen, no bulge. Negative pressure dressing in place.  Serosanguineous output in wound VAC canister, appearing more clear today.   Labs:     Latest Ref Rng & Units 03/07/2024    4:55 AM 03/06/2024    5:11 AM 02/28/2024    5:07 AM  CBC  WBC 4.0 - 10.5 K/uL 9.3  8.9  9.1   Hemoglobin 12.0 - 15.0 g/dL 89.2  88.3  87.9   Hematocrit 36.0 - 46.0 % 33.1  36.0  37.0   Platelets 150 - 400 K/uL 319  292  180       Latest Ref Rng & Units 03/07/2024    4:55 AM 03/06/2024    5:11 AM 02/28/2024    5:07 AM  CMP  Glucose 70 - 99 mg/dL 96  847  898   BUN 8 - 23 mg/dL 17  17  21    Creatinine 0.44 - 1.00 mg/dL 9.27  9.25  9.18   Sodium 135 - 145 mmol/L 140  138  139   Potassium 3.5 - 5.1 mmol/L 3.9  4.1  4.5   Chloride 98 - 111 mmol/L 103  100  104   CO2  22 - 32 mmol/L 27  25  26    Calcium 8.9 - 10.3 mg/dL 9.2  9.5  8.9     Imaging studies: No new pertinent imaging studies   Assessment/Plan:  69 y.o. female with open wound of abdominal wall 3 Days Post-Op s/p irrigation of abdominal wall wound    - Stable vital signs, no fever not tachycardic  - Abdominal pain is minimal, denies any other symptoms. The hardening of the skin on left abdomen is likely scar tissue from incisional hernia. Will continue to monitor.   - Still having serosanguineous output, no purulent or other suspicious output  - Planning for next dressing change to be tomorrow  - Continue IV Zosyn  and continue to ambulate  -- [employer] PA-C

## 2024-03-12 NOTE — TOC Progression Note (Signed)
 Transition of Care [facility]) - Progression Note    Patient Details  Name: Sarah Stephenson MRN: [id number] Date of Birth: 22-Jul-1955  [facility] CM/SW Contact  Corean ONEIDA Haddock, RN Phone Number: 03/12/2024, 10:40 AM  Clinical Narrative:      Plan for bedside wound vac change for tomorrow. Plan remains to dc home with [facility] services through [facility].  Home vac is at bedside                    Expected Discharge Plan and Services                                               Social Drivers of Health (SDOH) Interventions SDOH Screenings   Food Insecurity: No Food Insecurity (03/05/2024)  Housing: Low Risk (03/05/2024)  Transportation Needs: No Transportation Needs (03/05/2024)  Utilities: Not At Risk (03/05/2024)  Financial Resource Strain: Low Risk  (01/08/2024)   Received from [facility]  Social Connections: Unknown (03/06/2024)  Tobacco Use: High Risk (03/09/2024)    Readmission Risk Interventions     No data to display

## 2024-03-12 NOTE — Plan of Care (Signed)
" °  Problem: Education: Goal: Knowledge of the prescribed therapeutic regimen will improve Outcome: Progressing   Problem: Bowel/Gastric: Goal: Gastrointestinal status for postoperative course will improve Outcome: Progressing   Problem: Cardiac: Goal: Ability to maintain an adequate cardiac output Outcome: Progressing Goal: Will show no evidence of cardiac arrhythmias Outcome: Progressing   Problem: Nutritional: Goal: Will attain and maintain optimal nutritional status Outcome: Progressing   Problem: Neurological: Goal: Will regain or maintain usual level of consciousness Outcome: Progressing   Problem: Clinical Measurements: Goal: Ability to maintain clinical measurements within normal limits Outcome: Progressing   Problem: Respiratory: Goal: Will regain and/or maintain adequate ventilation Outcome: Progressing Goal: Respiratory status will improve Outcome: Progressing   Problem: Urinary Elimination: Goal: Ability to achieve and maintain adequate urine output Outcome: Progressing   Problem: Education: Goal: Knowledge of General Education information will improve Description: Including pain rating scale, medication(s)/side effects and non-pharmacologic comfort measures Outcome: Progressing   Problem: Health Behavior/Discharge Planning: Goal: Ability to manage health-related needs will improve Outcome: Progressing   Problem: Clinical Measurements: Goal: Ability to maintain clinical measurements within normal limits will improve Outcome: Progressing Goal: Will remain free from infection Outcome: Progressing Goal: Diagnostic test results will improve Outcome: Progressing Goal: Respiratory complications will improve Outcome: Progressing Goal: Cardiovascular complication will be avoided Outcome: Progressing   Problem: Activity: Goal: Risk for activity intolerance will decrease Outcome: Progressing   Problem: Nutrition: Goal: Adequate nutrition will be  maintained Outcome: Progressing   Problem: Coping: Goal: Level of anxiety will decrease Outcome: Progressing   Problem: Elimination: Goal: Will not experience complications related to bowel motility Outcome: Progressing Goal: Will not experience complications related to urinary retention Outcome: Progressing   Problem: Pain Managment: Goal: General experience of comfort will improve and/or be controlled Outcome: Progressing   Problem: Safety: Goal: Ability to remain free from injury will improve Outcome: Progressing   "

## 2024-03-13 ENCOUNTER — Other Ambulatory Visit: Payer: Self-pay

## 2024-03-13 MED ORDER — AMOXICILLIN-POT CLAVULANATE 875-125 MG PO TABS
1.0000 | ORAL_TABLET | Freq: Two times a day (BID) | ORAL | 0 refills | Status: AC
  Filled 2024-03-13: qty 28, 14d supply, fill #0

## 2024-03-13 MED ORDER — CELECOXIB 200 MG PO CAPS
200.0000 mg | ORAL_CAPSULE | Freq: Every day | ORAL | 0 refills | Status: AC
  Filled 2024-03-13: qty 30, 30d supply, fill #0

## 2024-03-13 NOTE — Plan of Care (Signed)
" °  Problem: Education: Goal: Knowledge of the prescribed therapeutic regimen will improve Outcome: Progressing   Problem: Bowel/Gastric: Goal: Gastrointestinal status for postoperative course will improve Outcome: Progressing   Problem: Cardiac: Goal: Ability to maintain an adequate cardiac output Outcome: Progressing Goal: Will show no evidence of cardiac arrhythmias Outcome: Progressing   Problem: Nutritional: Goal: Will attain and maintain optimal nutritional status Outcome: Progressing   Problem: Neurological: Goal: Will regain or maintain usual level of consciousness Outcome: Progressing   Problem: Clinical Measurements: Goal: Ability to maintain clinical measurements within normal limits Outcome: Progressing Goal: Postoperative complications will be avoided or minimized Outcome: Progressing   Problem: Respiratory: Goal: Will regain and/or maintain adequate ventilation Outcome: Progressing Goal: Respiratory status will improve Outcome: Progressing   Problem: Skin Integrity: Goal: Demonstrates signs of wound healing without infection Outcome: Progressing   Problem: Urinary Elimination: Goal: Will remain free from infection Outcome: Progressing Goal: Ability to achieve and maintain adequate urine output Outcome: Progressing   Problem: Education: Goal: Knowledge of General Education information will improve Description: Including pain rating scale, medication(s)/side effects and non-pharmacologic comfort measures Outcome: Progressing   Problem: Health Behavior/Discharge Planning: Goal: Ability to manage health-related needs will improve Outcome: Progressing   Problem: Clinical Measurements: Goal: Ability to maintain clinical measurements within normal limits will improve Outcome: Progressing Goal: Will remain free from infection Outcome: Progressing Goal: Diagnostic test results will improve Outcome: Progressing Goal: Respiratory complications will  improve Outcome: Progressing Goal: Cardiovascular complication will be avoided Outcome: Progressing   Problem: Activity: Goal: Risk for activity intolerance will decrease Outcome: Progressing   Problem: Nutrition: Goal: Adequate nutrition will be maintained Outcome: Progressing   Problem: Coping: Goal: Level of anxiety will decrease Outcome: Progressing   Problem: Pain Managment: Goal: General experience of comfort will improve and/or be controlled Outcome: Progressing   Problem: Safety: Goal: Ability to remain free from injury will improve Outcome: Progressing   "

## 2024-03-13 NOTE — Discharge Instructions (Addendum)
 Diet: Resume home heart healthy regular diet.   Activity: No heavy lifting >20 pounds (children, pets, laundry, garbage) or strenuous activity until follow-up, but light activity and walking are encouraged. Do not drive or drink alcohol if taking narcotic pain medications.  Wound care: May shower with soapy water  and pat dry (do not rub wound), but no baths or submerging incision underwater until follow-up. (no swimming) Keep the wound vac charged when possible and/or during the night. Plan for about 2x/week negative pressure dressing changes until further notice.   Medications: Resume all home medications. For mild to moderate pain: acetaminophen  (Tylenol ) or ibuprofen (if no kidney disease). Combining Tylenol  with alcohol can substantially increase your risk of causing liver disease. Narcotic pain medications, if prescribed, can be used for severe pain, though may cause nausea, constipation, and drowsiness. Complete a 14-day course of oral antibiotic as instructed. Take Celebrex  once daily as needed for pain.   Call office [phone]) at any time if any questions, worsening pain, fevers/chills, bleeding, drainage from incision site, or other concerns.

## 2024-03-13 NOTE — Discharge Summary (Signed)
 [facility]-General Surgery  SURGICAL DISCHARGE SUMMARY  Patient ID: Sarah Stephenson MRN: [id number] DOB/AGE: June 16, 1955 69 y.o.  Admit date: 03/05/2024 Discharge date: 03/13/2024  Discharge Diagnoses Patient Active Problem List   Diagnosis Date Noted   Wound, open, abdominal wall, anterior 03/05/2024   Incisional hernia 02/26/2024   COVID-19 09/19/2020   Long-term use of immunosuppressant medication 08/16/2020   Psoriasis 08/16/2020   Diverticulitis of colon 02/24/2016   Diverticulitis of intestine with abscess and bleeding 02/24/2016   Hematuria 02/24/2016   Near syncope 02/24/2016    Consultants None   Procedures Exploration of abdominal wound for suspected infection from previous incisional hernia repair with mesh on 03/05/24 Irrigation of abdominal wall wound on 03/09/24  Hospital Course:  Patient had an incisional hernia repair with mesh on 02/26/2024.  She presented to [facility] for follow-up with Dr. Rodolph on 03/05/2024 concerned about the color and character of drainage from previous drain site experiencing persistent dizziness and nausea. Dr. Rodolph recommended urgent return to the operating room for wound exploration irrigation to avoid deep infection especially with mesh having had a mesh placed. Patient was taken to the operating room that same evening on 03/05/2024 and had wound VAC placed to midline wound. 03/06/24 post-op Day 1: Stable, Dr. Rodolph changed wound VAC.  Continued IV Zosyn  and pain management.  03/07/24 post-op Day 2: Stable, continue negative pressure wound therapy, IV Zosyn  and pain management.  03/08/24 post-op Day 3: Stable, Dr. Rodolph noted turbid fluid on wound vac canister concerning for persistent infection. He changed wound VAC. Continue IV abx and pain management.  03/09/24 post-op Day 4: Patient was taken back to the OR for irrigation and possible debridement of wound given persistent turbid fluid concerning for deeper  abscess. No abscess source of fistula or infection was identified. 03/10/24 post-op Day 5: Stable, serosanguineous output noted.  Continue negative pressure dressing, IV antibiotics, and pain management. 03/11/24 post-op Day 6: Stable, improving drainage, still appeared serosanguineous.  Dr. Rodolph changed wound VAC.  Continued IV antibiotics and pain management. 03/12/24 post-op Day 7: Stable, output in wound VAC canister appearing more clear, still serosanguineous. Patient reported having symptoms of yeast infection.  One-time dose of fluconazole  150 mg was sent in.  Continue IV antibiotics and pain management. 03/13/24 post-op Day 8: Stable, output continues to appear serosanguineous, no purulent drainage noted.  Dr. Rodolph changed negative pressure dressing. Throughout her hospital course, patient has been tolerating regular diet, denying any nausea or vomiting. Wound appears to be healing well, no purulent drainage or necrotic tissue.  Pain appears to be controlled with pain medication.  Patient has been ambulating well.  Denies any other issues.  Patient is comfortable with going home. She is cleared from surgical standpoint. Home health has been set up to continue negative pressure dressing changes at home.  Patient is to continue 14-day course of oral antibiotics per Dr. Rodolph.  Will schedule follow-up in about 1 week for in-clinic dressing change.    Physical Examination:  Constitutional: alert, in no acute distress Pulmonary: CTA bilaterally, normal breath sounds Cardiac: regular rate and rhythm Gastrointestinal: soft, open midline wound with negative pressure dressing, and non-distended Skin: Dr. Rodolph exchanged the negative pressure dressing today. The previous negative pressure dressing was removed.The coverage area of the negative pressure dressing still 16 cm x 15 cm (240 cm). A new negative pressure dressing (DME) was placed in status with new portable wound vac.  Adequate negative pressure dressing achieved.    Allergies  as of 03/13/2024       Reactions   Oxycodone  Other (See Comments)   Gets very hyper with this.   Sulfa Antibiotics Hives   Hives   Venlafaxine Nausea Only        Medication List     STOP taking these medications    HYDROcodone -acetaminophen  5-325 MG tablet Commonly known as: NORCO/VICODIN   methocarbamol  500 MG tablet Commonly known as: ROBAXIN        TAKE these medications    acetaminophen  325 MG tablet Commonly known as: TYLENOL  Take 650 mg by mouth every 6 (six) hours as needed for mild pain (pain score 1-3).   albuterol  108 (90 Base) MCG/ACT inhaler Commonly known as: VENTOLIN  HFA Inhale 2 puffs into the lungs every 2 (two) hours as needed for wheezing or shortness of breath (cough).   amLODipine  5 MG tablet Commonly known as: NORVASC  Take 5 mg by mouth daily.   amoxicillin-clavulanate 875-125 MG tablet Commonly known as: AUGMENTIN Take 1 tablet by mouth 2 (two) times daily for 14 days.   benzonatate  100 MG capsule Commonly known as: Tessalon  Perles Take 1 capsule (100 mg total) by mouth 3 (three) times daily as needed for cough (cough).   buPROPion  150 MG 24 hr tablet Commonly known as: WELLBUTRIN  XL Take 150 mg by mouth daily.   celecoxib  200 MG capsule Commonly known as: CeleBREX  Take 1 capsule (200 mg total) by mouth daily.   CO Q-10 PO Take 1 capsule by mouth daily.   esomeprazole 20 MG capsule Commonly known as: NEXIUM Take 20 mg by mouth daily as needed (acid reflux).   ezetimibe 10 MG tablet Commonly known as: ZETIA Take 10 mg by mouth daily.   gabapentin  300 MG capsule Commonly known as: NEURONTIN  Take 1 capsule (300 mg total) by mouth 2 (two) times daily.   levothyroxine  137 MCG tablet Commonly known as: SYNTHROID  Take 137 mcg by mouth daily before breakfast.   mometasone  0.1 % cream Commonly known as: ELOCON  Apply 1 Application topically daily as needed (Rash).    Taltz  80 MG/ML pen Generic drug: ixekizumab  INJECT 1 PEN UNDER THE SKIN EVERY 4 WEEKS   Tremfya  One-Press 100 MG/ML Sopn Generic drug: Guselkumab  Inject 100 mg into the skin every 8 (eight) weeks. Maintenance dose, 100mg  sq injections q 8 weeks   Tremfya  One-Press 100 MG/ML Sopn Generic drug: Guselkumab  Inject 1 mL into the skin as directed. Inject 1 pen on week 0 and then again on week 4 for loading dose               Durable Medical Equipment  (From admission, onward)           Start     Ordered   03/06/24 1037  For home use only DME Negative pressure wound device  Once       Question Answer Comment  Frequency of dressing change 2 times per week   Length of need 3 Months   Dressing type Foam   Amount of suction 125 mm/Hg   Pressure application Continuous pressure   Supplies 10 canisters and 15 dressings per month for duration of therapy      03/06/24 1037              Follow-up Information     Rodolph Romano, MD Follow up in 6 day(s).   Specialty: General Surgery Why: open abdominal wound, dressing change next Thrusday (03/20/23) per Dr. Rodolph Pass information: [address] [place]  [place] [address] [phone]                  Time spent on discharge management including discussion of hospital course, clinical condition, outpatient instructions, prescriptions, and follow up with the patient and members of the medical team: >30 minutes  Riyad Keena Barrientos PA-C

## 2024-03-13 NOTE — Progress Notes (Signed)
 Patient discharging home, all belongings sent with patient, IV removed. New wound vac replaced by MD and PA earlier this morning. All questions and concerns answered.

## 2024-03-13 NOTE — TOC Transition Note (Signed)
 Transition of Care [facility]) - Discharge Note   Patient Details  Name: Sarah Stephenson MRN: [id number] Date of Birth: 1955/10/25  [facility] CM/SW Contact:  Corean ONEIDA Haddock, RN Phone Number: 03/13/2024, 2:20 PM   Clinical Narrative:     Received Message from surgery PA inquiring if home health will be able to provide vac change on Monday or Tuesday.   Per Darleene with [facility] they will try their best to see the patient on Monday or Tuesday, but it will be dependent on the pending incoming inclement weather.   Received notification from surgery to proceed with discharge.  Per Dr Cesar Tell them to make the best effort to be there on Monday .  Darleene with [facility] notified         Patient Goals and CMS Choice            Discharge Placement                       Discharge Plan and Services Additional resources added to the After Visit Summary for                                       Social Drivers of Health (SDOH) Interventions SDOH Screenings   Food Insecurity: No Food Insecurity (03/05/2024)  Housing: Low Risk (03/05/2024)  Transportation Needs: No Transportation Needs (03/05/2024)  Utilities: Not At Risk (03/05/2024)  Financial Resource Strain: Low Risk  (01/08/2024)   Received from [facility]  Social Connections: Unknown (03/06/2024)  Tobacco Use: High Risk (03/09/2024)     Readmission Risk Interventions     No data to display

## 2024-07-02 ENCOUNTER — Ambulatory Visit: Admitting: Dermatology
